# Patient Record
Sex: Female | Born: 1995 | Race: Black or African American | Hispanic: No | Marital: Single | Smoking: Never smoker
Health system: Southern US, Community
[De-identification: ages and names within clinical notes are randomized; demographics above are authoritative.]

## PROBLEM LIST (undated history)

## (undated) DIAGNOSIS — D649 Anemia, unspecified: Secondary | ICD-10-CM

## (undated) HISTORY — DX: Anemia, unspecified: D64.9

---
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
## 2017-02-15 ENCOUNTER — Other Ambulatory Visit (HOSPITAL_COMMUNITY)
Admission: RE | Admit: 2017-02-15 | Discharge: 2017-02-15 | Disposition: A | Discharge status: Home / Self Care | Payer: Medicaid Other | Source: Ambulatory Visit | Attending: Certified Nurse Midwife | Admitting: Certified Nurse Midwife

## 2017-02-15 ENCOUNTER — Encounter: Payer: Self-pay | Admitting: Obstetrics

## 2017-02-15 ENCOUNTER — Inpatient Hospital Stay: Admission: RE | Admit: 2017-02-15 | Payer: Self-pay | Source: Ambulatory Visit

## 2017-02-15 ENCOUNTER — Ambulatory Visit (INDEPENDENT_AMBULATORY_CARE_PROVIDER_SITE_OTHER): Payer: Medicaid Other | Admitting: Certified Nurse Midwife

## 2017-02-15 VITALS — BP 116/76 | HR 88 | Ht 63.0 in | Wt 232.0 lb

## 2017-02-15 DIAGNOSIS — Z0000 Encounter for general adult medical examination without abnormal findings: Secondary | ICD-10-CM

## 2017-02-15 DIAGNOSIS — Z01411 Encounter for gynecological examination (general) (routine) with abnormal findings: Secondary | ICD-10-CM

## 2017-02-15 DIAGNOSIS — N898 Other specified noninflammatory disorders of vagina: Secondary | ICD-10-CM | POA: Diagnosis present

## 2017-02-15 DIAGNOSIS — R87810 Cervical high risk human papillomavirus (HPV) DNA test positive: Secondary | ICD-10-CM | POA: Insufficient documentation

## 2017-02-15 DIAGNOSIS — Z3041 Encounter for surveillance of contraceptive pills: Secondary | ICD-10-CM

## 2017-02-15 DIAGNOSIS — Z7251 High risk heterosexual behavior: Secondary | ICD-10-CM

## 2017-02-15 DIAGNOSIS — Z01419 Encounter for gynecological examination (general) (routine) without abnormal findings: Secondary | ICD-10-CM

## 2017-02-15 DIAGNOSIS — E6609 Other obesity due to excess calories: Secondary | ICD-10-CM

## 2017-02-15 MED ORDER — NORETHIN ACE-ETH ESTRAD-FE 1-20 MG-MCG(24) PO TABS: 1.0000 | ORAL_TABLET | Freq: Every day | ORAL | 12 refills | Status: DC

## 2017-02-15 MED ORDER — PRENATE PIXIE 10-0.6-0.4-200 MG PO CAPS: 1.0000 | ORAL_CAPSULE | Freq: Every day | ORAL | 12 refills | Status: DC
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
## 2017-02-15 NOTE — Progress Notes (Signed)
Transferred to R.Denney CNM, prefers female providers.

## 2017-02-16 ENCOUNTER — Other Ambulatory Visit: Payer: Self-pay | Admitting: Certified Nurse Midwife

## 2017-02-16 DIAGNOSIS — B373 Candidiasis of vulva and vagina: Secondary | ICD-10-CM

## 2017-02-16 DIAGNOSIS — N760 Acute vaginitis: Secondary | ICD-10-CM

## 2017-02-16 DIAGNOSIS — B9689 Other specified bacterial agents as the cause of diseases classified elsewhere: Secondary | ICD-10-CM

## 2017-02-16 DIAGNOSIS — B3731 Acute candidiasis of vulva and vagina: Secondary | ICD-10-CM

## 2017-02-16 LAB — HIV ANTIBODY (ROUTINE TESTING W REFLEX): HIV Screen 4th Generation wRfx: NONREACTIVE

## 2017-02-16 LAB — CBC
HEMATOCRIT: 36.2 % (ref 34.0–46.6)
Hemoglobin: 11.4 g/dL (ref 11.1–15.9)
MCH: 26.1 pg — AB (ref 26.6–33.0)
MCHC: 31.5 g/dL (ref 31.5–35.7)
MCV: 83 fL (ref 79–97)
PLATELETS: 378 10*3/uL (ref 150–379)
RBC: 4.36 x10E6/uL (ref 3.77–5.28)
RDW: 15.3 % (ref 12.3–15.4)
WBC: 6.2 10*3/uL (ref 3.4–10.8)

## 2017-02-16 LAB — COMPREHENSIVE METABOLIC PANEL
ALBUMIN: 4.2 g/dL (ref 3.5–5.5)
ALT: 10 IU/L (ref 0–32)
AST: 15 IU/L (ref 0–40)
Albumin/Globulin Ratio: 1.6 (ref 1.2–2.2)
Alkaline Phosphatase: 40 IU/L — ABNORMAL LOW (ref 43–101)
BUN / CREAT RATIO: 10 (ref 9–23)
BUN: 8 mg/dL (ref 6–20)
CALCIUM: 9.1 mg/dL (ref 8.7–10.2)
CHLORIDE: 103 mmol/L (ref 96–106)
CO2: 22 mmol/L (ref 18–29)
CREATININE: 0.77 mg/dL (ref 0.57–1.00)
GFR, EST AFRICAN AMERICAN: 130 mL/min/{1.73_m2} (ref >59)
GFR, EST NON AFRICAN AMERICAN: 113 mL/min/{1.73_m2} (ref >59)
GLUCOSE: 89 mg/dL (ref 65–99)
Globulin, Total: 2.7 g/dL (ref 1.5–4.5)
Potassium: 4.3 mmol/L (ref 3.5–5.2)
Sodium: 139 mmol/L (ref 134–144)
TOTAL PROTEIN: 6.9 g/dL (ref 6.0–8.5)

## 2017-02-16 LAB — HEPATITIS B SURFACE ANTIGEN: HEP B S AG: NEGATIVE

## 2017-02-16 LAB — CERVICOVAGINAL ANCILLARY ONLY
Bacterial vaginitis: POSITIVE — AB
Candida vaginitis: POSITIVE — AB

## 2017-02-16 LAB — TSH: TSH: 2.35 u[IU]/mL (ref 0.450–4.500)

## 2017-02-16 LAB — RPR: RPR Ser Ql: NONREACTIVE

## 2017-02-16 LAB — HEPATITIS C ANTIBODY: Hep C Virus Ab: <0.1 s/co ratio (ref 0.0–0.9)

## 2017-02-16 MED ORDER — METRONIDAZOLE 500 MG PO TABS: 500.0000 mg | ORAL_TABLET | Freq: Two times a day (BID) | ORAL | 0 refills | Status: DC

## 2017-02-16 MED ORDER — TERCONAZOLE 0.8 % VA CREA: 1.0000 | TOPICAL_CREAM | Freq: Every day | VAGINAL | 0 refills | Status: DC

## 2017-02-16 MED ORDER — FLUCONAZOLE 150 MG PO TABS: 150.0000 mg | ORAL_TABLET | Freq: Once | ORAL | 0 refills | Status: AC

## 2017-02-18 LAB — CERVICOVAGINAL ANCILLARY ONLY
HPV (WINDOPATH): DETECTED — AB
HPV 16/18/45 genotyping: NEGATIVE

## 2017-02-18 LAB — HEMOGLOBIN A1C
Est. average glucose Bld gHb Est-mCnc: 100 mg/dL
Hgb A1c MFr Bld: 5.1 % (ref 4.8–5.6)

## 2017-02-18 LAB — SPECIMEN STATUS REPORT

## 2017-02-19 ENCOUNTER — Emergency Department (HOSPITAL_COMMUNITY): Payer: Medicaid Other

## 2017-02-19 ENCOUNTER — Emergency Department (HOSPITAL_COMMUNITY)
Admission: EM | Admit: 2017-02-19 | Discharge: 2017-02-20 | Disposition: A | Discharge status: Home / Self Care | Payer: Medicaid Other | Attending: Emergency Medicine | Admitting: Emergency Medicine

## 2017-02-19 ENCOUNTER — Encounter (HOSPITAL_COMMUNITY): Payer: Self-pay | Admitting: Emergency Medicine

## 2017-02-19 DIAGNOSIS — Z79899 Other long term (current) drug therapy: Secondary | ICD-10-CM | POA: Insufficient documentation

## 2017-02-19 DIAGNOSIS — S61551A Open bite of right wrist, initial encounter: Secondary | ICD-10-CM | POA: Insufficient documentation

## 2017-02-19 DIAGNOSIS — W503XXA Accidental bite by another person, initial encounter: Secondary | ICD-10-CM

## 2017-02-19 DIAGNOSIS — Z23 Encounter for immunization: Secondary | ICD-10-CM | POA: Diagnosis not present

## 2017-02-19 DIAGNOSIS — S80811A Abrasion, right lower leg, initial encounter: Secondary | ICD-10-CM | POA: Insufficient documentation

## 2017-02-19 DIAGNOSIS — Y9389 Activity, other specified: Secondary | ICD-10-CM | POA: Insufficient documentation

## 2017-02-19 DIAGNOSIS — T148XXA Other injury of unspecified body region, initial encounter: Secondary | ICD-10-CM

## 2017-02-19 DIAGNOSIS — M791 Myalgia, unspecified site: Secondary | ICD-10-CM

## 2017-02-19 DIAGNOSIS — S6991XA Unspecified injury of right wrist, hand and finger(s), initial encounter: Secondary | ICD-10-CM | POA: Diagnosis present

## 2017-02-19 DIAGNOSIS — Y999 Unspecified external cause status: Secondary | ICD-10-CM | POA: Diagnosis not present

## 2017-02-19 DIAGNOSIS — S80812A Abrasion, left lower leg, initial encounter: Secondary | ICD-10-CM | POA: Insufficient documentation

## 2017-02-19 DIAGNOSIS — S5011XA Contusion of right forearm, initial encounter: Secondary | ICD-10-CM | POA: Diagnosis not present

## 2017-02-19 DIAGNOSIS — Y92009 Unspecified place in unspecified non-institutional (private) residence as the place of occurrence of the external cause: Secondary | ICD-10-CM | POA: Insufficient documentation

## 2017-02-19 MED ORDER — TETANUS-DIPHTH-ACELL PERTUSSIS 5-2.5-18.5 LF-MCG/0.5 IM SUSP
0.5000 mL | Freq: Once | INTRAMUSCULAR | Status: AC
  Administered 2017-02-20: 0.5 mL via INTRAMUSCULAR
  Filled 2017-02-19: qty 0.5
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
## 2017-02-20 ENCOUNTER — Emergency Department (HOSPITAL_COMMUNITY): Payer: Medicaid Other

## 2017-02-20 MED ORDER — METHOCARBAMOL 500 MG PO TABS: 500.0000 mg | ORAL_TABLET | Freq: Two times a day (BID) | ORAL | 0 refills | Status: DC | PRN

## 2017-02-20 MED ORDER — AMOXICILLIN-POT CLAVULANATE 875-125 MG PO TABS: 1.0000 | ORAL_TABLET | Freq: Two times a day (BID) | ORAL | 0 refills | Status: DC

## 2017-02-20 MED ORDER — HYDROCODONE-ACETAMINOPHEN 5-325 MG PO TABS
1.0000 | ORAL_TABLET | Freq: Once | ORAL | Status: AC
  Administered 2017-02-20: 1 via ORAL
  Filled 2017-02-20: qty 1

## 2017-02-20 MED ORDER — NAPROXEN 500 MG PO TABS: 500.0000 mg | ORAL_TABLET | Freq: Two times a day (BID) | ORAL | 0 refills | Status: DC | PRN

## 2017-02-20 MED ORDER — BACITRACIN ZINC 500 UNIT/GM EX OINT
TOPICAL_OINTMENT | CUTANEOUS | Status: AC
  Filled 2017-02-20: qty 0.9

## 2017-02-20 NOTE — ED Notes (Signed)
Pt ambulatory to xray with steady gait.
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
## 2017-02-21 ENCOUNTER — Other Ambulatory Visit: Payer: Self-pay | Admitting: Certified Nurse Midwife

## 2017-02-21 DIAGNOSIS — A630 Anogenital (venereal) warts: Secondary | ICD-10-CM

## 2017-02-22 ENCOUNTER — Encounter: Payer: Self-pay | Admitting: *Deleted

## 2017-06-14 ENCOUNTER — Encounter: Payer: Self-pay | Admitting: Certified Nurse Midwife

## 2017-06-14 ENCOUNTER — Ambulatory Visit (INDEPENDENT_AMBULATORY_CARE_PROVIDER_SITE_OTHER): Payer: Medicaid Other | Admitting: Certified Nurse Midwife

## 2017-06-14 DIAGNOSIS — B977 Papillomavirus as the cause of diseases classified elsewhere: Secondary | ICD-10-CM | POA: Diagnosis not present
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
## 2018-08-12 ENCOUNTER — Other Ambulatory Visit: Payer: Self-pay

## 2018-08-12 ENCOUNTER — Encounter (HOSPITAL_COMMUNITY): Payer: Self-pay

## 2018-08-12 ENCOUNTER — Emergency Department (HOSPITAL_COMMUNITY)
Admission: EM | Admit: 2018-08-12 | Discharge: 2018-08-12 | Disposition: A | Discharge status: Home / Self Care | Payer: Self-pay | Attending: Emergency Medicine | Admitting: Emergency Medicine

## 2018-08-12 DIAGNOSIS — Z79899 Other long term (current) drug therapy: Secondary | ICD-10-CM | POA: Insufficient documentation

## 2018-08-12 DIAGNOSIS — J029 Acute pharyngitis, unspecified: Secondary | ICD-10-CM | POA: Insufficient documentation

## 2018-08-12 LAB — GROUP A STREP BY PCR: GROUP A STREP BY PCR: NOT DETECTED

## 2018-08-12 MED ORDER — AMOXICILLIN 500 MG PO CAPS: 500.0000 mg | ORAL_CAPSULE | Freq: Two times a day (BID) | ORAL | 0 refills | Status: AC

## 2018-08-12 NOTE — ED Triage Notes (Addendum)
Pt reports sore throat x2 days. Redness, swelling, and white patches noted in back of throat. Denies, fever, cough, congestion, N/V.

## 2018-08-12 NOTE — Discharge Instructions (Signed)
Take antibiotic twice a day for the next 10 days until it is completely gone.  Come back to the ER if you have any new or concerning symptoms like difficulty moving her neck, inability to swallow, change in your voice or drooling.

## 2018-08-12 NOTE — ED Notes (Signed)
Bed: WTR7 Expected date:  Expected time:  Means of arrival:  Comments: 

## 2018-08-12 NOTE — ED Provider Notes (Signed)
[facility]-EMERGENCY DEPT Provider Note   CSN: [id number] Arrival date & time: 08/12/18  1112     History   Chief Complaint Chief Complaint  Patient presents with  . Sore Throat    HPI Claudia Zimmerman is a 22 y.o. female.  HPI   Claudia Zimmerman is a 22 year old female with no significant past medical history who presents to the emergency department for evaluation of sore throat.  Patient reports that 2 days ago she developed pain in the back of her throat which is worse with swallowing.  Has not taken any over-the-counter medications for her symptoms.  States that she noticed that there were white spots on her tonsils.  States that she feels as if the back of her throat is swollen, but is able to swallow without difficulty.  No sick contacts with similar symptoms.  She denies recent illness with cough, congestion, ear pain, fever.  Denies neck stiffness, headache.  Past Medical History:  Diagnosis Date  . Anemia     Patient Active Problem List   Diagnosis Date Noted  . 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 Genital warts 02/21/2017  . Migraine without aura and without status migrainosus, not intractable 12/02/2014  . Tension headache 12/02/2014    Past Surgical History:  Procedure Laterality Date  . ADENOIDECTOMY Bilateral 2002   Performed in [place]     OB History    Gravida  0   Para  0   Term  0   Preterm  0   AB  0   Living  0     SAB  0   TAB  0   Ectopic  0   Multiple  0   Live Births  0            Home Medications    Prior to Admission medications   Medication Sig Start Date End Date Taking? Authorizing Provider  cetirizine (ZYRTEC) 10 MG tablet Take 10 mg by mouth daily as needed for allergies.  08/30/14   [provider]  clotrimazole (LOTRIMIN) 1 % cream Apply to affected area 2 times daily Patient not taking: Reported on 06/14/2017 06/30/15   Elpidio Anis, PA-C  fluticasone (FLONASE) 50 MCG/ACT nasal  spray Place 2 sprays into both nostrils daily.  08/30/14   [provider]  ibuprofen (ADVIL,MOTRIN) 200 MG tablet Take 400 mg by mouth every 6 (six) hours as needed.    [provider]  naproxen (NAPROSYN) 500 MG tablet Take 1 tablet (500 mg total) by mouth 2 (two) times daily as needed. Patient not taking: Reported on 06/14/2017 02/20/17   Ward, Chase Picket, PA-C  Norethindrone Acetate-Ethinyl Estrad-FE (BLISOVI 24 FE) 1-20 MG-MCG(24) tablet Take 1 tablet by mouth daily. Patient not taking: Reported on 06/14/2017 02/15/17   Orvilla Cornwall A, CNM  Prenat-FeAsp-Meth-FA-DHA w/o A (PRENATE PIXIE) 10-0.6-0.4-200 MG CAPS Take 1 tablet by mouth daily. 02/15/17   Denney, Rachelle A, CNM  STOOL SOFTENER 100 MG capsule Take 100 mg by mouth daily. 08/30/14   [provider]  Vitamin D, Ergocalciferol, (DRISDOL) 50000 UNITS CAPS capsule Take 50,000 Units by mouth.  11/04/14   [provider]    Family History Family History  Problem Relation Age of Onset  . Depression Mother   . Anxiety disorder Mother   . Headache Father   . Bipolar disorder Maternal Grandmother     Social History Social History   Tobacco Use  . Smoking status: Never  Smoker  . Smokeless tobacco: Never Used  Substance Use Topics  . Alcohol use: No  . Drug use: No     Allergies   Other   Review of Systems Review of Systems  Constitutional: Negative for chills and fever.  HENT: Positive for sore throat. Negative for congestion, ear pain, trouble swallowing and voice change.   Respiratory: Negative for cough.   Skin: Negative for rash.     Physical Exam Updated Vital Signs BP 132/85   Pulse 95   Temp 98.2 F (36.8 C)   Resp 16   SpO2 99%   Physical Exam  Constitutional: She appears well-developed and well-nourished. No distress.  No acute distress, nontoxic-appearing.  HENT:  Head: Normocephalic and atraumatic.  Mucous memories moist.  Posterior oropharynx erythematous  with 3+ bilateral tonsillar swelling.  Exudate present.  Uvula midline.  Airway patent and able to handle oral secretions without difficulty.  Eyes: Conjunctivae are normal. Right eye exhibits no discharge. Left eye exhibits no discharge.  Neck: Normal range of motion. Neck supple.  Full neck range of motion.  Bilateral anterior chain cervical adenopathy present.  Pulmonary/Chest: Effort normal. No respiratory distress.  Neurological: She is alert. Coordination normal.  Skin: She is not diaphoretic.  Psychiatric: She has a normal mood and affect. Her behavior is normal.  Nursing note and vitals reviewed.    ED Treatments / Results  Labs (all labs ordered are listed, but only abnormal results are displayed) Labs Reviewed  GROUP A STREP BY PCR    EKG None  Radiology No results found.  Procedures Procedures (including critical care time)  Medications Ordered in ED Medications - No data to display   Initial Impression / Assessment and Plan / ED Course  I have reviewed the triage vital signs and the nursing notes.  Pertinent labs & imaging results that were available during my care of the patient were reviewed by me and considered in my medical decision making (see chart for details).    Pt with sore throat, cervical lymphadenopathy, tonsillar exudate. Exam consistent with strep pharyngitis. Plan to treat with amoxicillin BID x 10d.  Pt appears well hydrated. Presentation non concerning for PTA or RPA. No trismus or uvula deviation. Specific return precautions discussed. Pt able to drink water in ED without difficulty with intact air way.  Final Clinical Impressions(s) / ED Diagnoses   Final diagnoses:  Sore throat    ED Discharge Orders         Ordered    amoxicillin (AMOXIL) 500 MG capsule  2 times daily     08/12/18 1248           Kellie Shropshire, Cordelia Poche 08/12/18 1249    Alvira Monday, MD 08/15/18 2210
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
## 2018-11-17 ENCOUNTER — Encounter (HOSPITAL_COMMUNITY): Payer: Self-pay

## 2018-11-17 ENCOUNTER — Emergency Department (HOSPITAL_COMMUNITY)
Admission: EM | Admit: 2018-11-17 | Discharge: 2018-11-17 | Disposition: A | Discharge status: Home / Self Care | Payer: Medicaid Other | Attending: Emergency Medicine | Admitting: Emergency Medicine

## 2018-11-17 DIAGNOSIS — L0501 Pilonidal cyst with abscess: Secondary | ICD-10-CM | POA: Insufficient documentation

## 2018-11-17 MED ORDER — HYDROCODONE-ACETAMINOPHEN 5-325 MG PO TABS: 1.0000 | ORAL_TABLET | Freq: Four times a day (QID) | ORAL | 0 refills | Status: DC | PRN

## 2018-11-17 MED ORDER — AMOXICILLIN-POT CLAVULANATE 875-125 MG PO TABS: 1.0000 | ORAL_TABLET | Freq: Two times a day (BID) | ORAL | 0 refills | Status: DC

## 2018-11-17 MED ORDER — IBUPROFEN 800 MG PO TABS: 800.0000 mg | ORAL_TABLET | Freq: Three times a day (TID) | ORAL | 0 refills | Status: DC | PRN

## 2018-11-17 MED ORDER — AMOXICILLIN-POT CLAVULANATE 875-125 MG PO TABS
1.0000 | ORAL_TABLET | Freq: Once | ORAL | Status: DC
  Filled 2018-11-17: qty 1

## 2018-11-17 MED ORDER — LIDOCAINE-EPINEPHRINE (PF) 2 %-1:200000 IJ SOLN
20.0000 mL | Freq: Once | INTRAMUSCULAR | Status: AC
  Administered 2018-11-17: 20 mL
  Filled 2018-11-17: qty 20

## 2018-11-17 MED ORDER — HYDROMORPHONE HCL 1 MG/ML IJ SOLN
1.0000 mg | Freq: Once | INTRAMUSCULAR | Status: AC
  Administered 2018-11-17: 1 mg via INTRAVENOUS
  Filled 2018-11-17: qty 1

## 2018-11-17 MED ORDER — SODIUM CHLORIDE 0.9 % IV BOLUS
500.0000 mL | Freq: Once | INTRAVENOUS | Status: AC
  Administered 2018-11-17: 500 mL via INTRAVENOUS

## 2018-11-17 NOTE — ED Triage Notes (Signed)
Pt believes that she has a cyst on her tailbone. Pt reports that the spot is swollen and red but no drainage from that area.

## 2018-11-17 NOTE — ED Provider Notes (Signed)
[facility]-EMERGENCY DEPT Provider Note   CSN: [id number] Arrival date & time: 11/17/18  1005     History   Chief Complaint Chief Complaint  Patient presents with  . Cyst    HPI Claudia Zimmerman is a 23 y.o. female.  The history is provided by the patient. No language interpreter was used.   Claudia Zimmerman is a 23 y.o. female who presents to the Emergency Department complaining of bump on bottom. She presents to the emergency department complaining of a lump on her tailbone. She noticed cyst about six days ago. She has increased pain and swelling in the area. She had a temperature to 100.52 days ago. She denies any nausea, vomiting, abdominal pain, diarrhea. No prior similar symptoms. Symptoms are moderate, constant, worsening. Past Medical History:  Diagnosis Date  . Anemia     Patient Active Problem List   Diagnosis Date Noted  . 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 Genital warts 02/21/2017  . Migraine without aura and without status migrainosus, not intractable 12/02/2014  . Tension headache 12/02/2014    Past Surgical History:  Procedure Laterality Date  . ADENOIDECTOMY Bilateral 2002   Performed in [place]     OB History    Gravida  0   Para  0   Term  0   Preterm  0   AB  0   Living  0     SAB  0   TAB  0   Ectopic  0   Multiple  0   Live Births  0            Home Medications    Prior to Admission medications   Medication Sig Start Date End Date Taking? Authorizing Provider  acetaminophen (TYLENOL) 325 MG tablet Take 975 mg by mouth every 6 (six) hours as needed for mild pain or headache.   Yes [provider]  amoxicillin-clavulanate (AUGMENTIN) 875-125 MG tablet Take 1 tablet by mouth every 12 (twelve) hours. 11/17/18   Tilden Fossa, MD  clotrimazole (LOTRIMIN) 1 % cream Apply to affected area 2 times daily Patient not taking: Reported on 06/14/2017 06/30/15   Elpidio Anis, PA-C    HYDROcodone-acetaminophen (NORCO/VICODIN) 5-325 MG tablet Take 1 tablet by mouth every 6 (six) hours as needed. 11/17/18   Tilden Fossa, MD  ibuprofen (ADVIL,MOTRIN) 800 MG tablet Take 1 tablet (800 mg total) by mouth every 8 (eight) hours as needed. 11/17/18   Tilden Fossa, MD  naproxen (NAPROSYN) 500 MG tablet Take 1 tablet (500 mg total) by mouth 2 (two) times daily as needed. Patient not taking: Reported on 06/14/2017 02/20/17   Ward, Chase Picket, PA-C  Norethindrone Acetate-Ethinyl Estrad-FE (BLISOVI 24 FE) 1-20 MG-MCG(24) tablet Take 1 tablet by mouth daily. Patient not taking: Reported on 06/14/2017 02/15/17   Orvilla Cornwall A, CNM  Prenat-FeAsp-Meth-FA-DHA w/o A (PRENATE PIXIE) 10-0.6-0.4-200 MG CAPS Take 1 tablet by mouth daily. Patient not taking: Reported on 11/17/2018 02/15/17   Roe Coombs, CNM    Family History Family History  Problem Relation Age of Onset  . Depression Mother   . Anxiety disorder Mother   . Headache Father   . Bipolar disorder Maternal Grandmother     Social History Social History   Tobacco Use  . Smoking status: Never Smoker  . Smokeless tobacco: Never Used  Substance Use Topics  . Alcohol use: No  . Drug use: No     Allergies   Other  Review of Systems Review of Systems  All other systems reviewed and are negative.    Physical Exam Updated Vital Signs BP 125/75 (BP Location: Right Arm)   Pulse 99   Temp 98.5 F (36.9 C) (Oral)   Resp 18   Ht 5\' 2"  (1.575 m)   Wt 108.9 kg   LMP 11/02/2018 (Approximate)   SpO2 99%   BMI 43.90 kg/m   Physical Exam Vitals signs and nursing note reviewed. Exam conducted with a chaperone present.  Constitutional:      Appearance: Normal appearance.  HENT:     Head: Normocephalic and atraumatic.     Mouth/Throat:     Mouth: Mucous membranes are moist.  Neck:     Musculoskeletal: Normal range of motion.  Cardiovascular:     Rate and Rhythm: Normal rate and regular rhythm.   Pulmonary:     Effort: Pulmonary effort is normal. No respiratory distress.  Abdominal:     General: There is no distension.     Palpations: Abdomen is soft.     Tenderness: There is no abdominal tenderness.  Genitourinary:    Comments: Moderate erythema, induration and tenderness with focal fluctuance at the gluteal cleft Skin:    General: Skin is warm and dry.     Capillary Refill: Capillary refill takes less than 2 seconds.  Neurological:     Mental Status: She is alert and oriented to person, place, and time.  Psychiatric:        Mood and Affect: Mood normal.        Behavior: Behavior normal.      ED Treatments / Results  Labs (all labs ordered are listed, but only abnormal results are displayed) Labs Reviewed - No data to display  EKG None  Radiology No results found.  Procedures .Marland Kitchen.Incision and Drainage Date/Time: 11/17/2018 3:44 PM Performed by: Tilden Fossaees, Marialice Newkirk, MD Authorized by: Tilden Fossaees, Todrick Siedschlag, MD   Consent:    Consent obtained:  Verbal   Consent given by:  Patient   Risks discussed:  Bleeding, incomplete drainage, pain and infection Location:    Type:  Pilonidal cyst Pre-procedure details:    Skin preparation:  Chloraprep Anesthesia (see MAR for exact dosages):    Anesthesia method:  Local infiltration   Local anesthetic:  Lidocaine 2% WITH epi Procedure type:    Complexity:  Complex Procedure details:    Incision types:  Single straight   Scalpel blade:  11   Wound management:  Probed and deloculated, irrigated with saline and extensive cleaning   Drainage:  Purulent   Drainage amount:  Copious   Wound treatment:  Drain placed   Packing materials:  1/2 in iodoform gauze Post-procedure details:    Patient tolerance of procedure:  Tolerated well, no immediate complications Comments:     Abscess cavity was 3 to 4 cm deep and 4 to 5 cm in diameter.   (including critical care time)  Medications Ordered in ED Medications  amoxicillin-clavulanate  (AUGMENTIN) 875-125 MG per tablet 1 tablet (has no administration in time range)  HYDROmorphone (DILAUDID) injection 1 mg (1 mg Intravenous Given 11/17/18 1211)  sodium chloride 0.9 % bolus 500 mL (0 mLs Intravenous Stopped 11/17/18 1408)  lidocaine-EPINEPHrine (XYLOCAINE W/EPI) 2 %-1:200000 (PF) injection 20 mL (20 mLs Infiltration Given by Other 11/17/18 1211)     Initial Impression / Assessment and Plan / ED Course  I have reviewed the triage vital signs and the nursing notes.  Pertinent labs & imaging results that  were available during my care of the patient were reviewed by me and considered in my medical decision making (see chart for details).     Patient here for evaluation of pain and swelling to her tailbone. She has a large pile an idol cyst on examination. I&D formed per procedure note. Patient tolerated the procedure well. Given the extent and size of abscess plan to have her follow up with general surgery. Discussed with patient local wound care as well as return precautions. Presentation is not consistent with sepsis.  Final Clinical Impressions(s) / ED Diagnoses   Final diagnoses:  Pilonidal cyst with abscess    ED Discharge Orders         Ordered    amoxicillin-clavulanate (AUGMENTIN) 875-125 MG tablet  Every 12 hours     11/17/18 1422    HYDROcodone-acetaminophen (NORCO/VICODIN) 5-325 MG tablet  Every 6 hours PRN     11/17/18 1422    ibuprofen (ADVIL,MOTRIN) 800 MG tablet  Every 8 hours PRN     11/17/18 1422           Tilden Fossa, MD 11/17/18 1546

## 2018-11-17 NOTE — ED Notes (Signed)
Scheduled appointment for patient at [facility] per Dr. Madilyn Hook request for Tuesday 11/2118. Be there 1:30 to do new patient paperwork. Appointment with Puja,PA at 2:00pm.

## 2020-07-15 ENCOUNTER — Encounter (HOSPITAL_COMMUNITY): Payer: Self-pay

## 2020-07-15 ENCOUNTER — Emergency Department (HOSPITAL_COMMUNITY)
Admission: EM | Admit: 2020-07-15 | Discharge: 2020-07-15 | Disposition: A | Discharge status: Home / Self Care | Payer: Medicaid Other | Attending: Emergency Medicine | Admitting: Emergency Medicine

## 2020-07-15 ENCOUNTER — Other Ambulatory Visit: Payer: Self-pay

## 2020-07-15 DIAGNOSIS — L0591 Pilonidal cyst without abscess: Secondary | ICD-10-CM | POA: Insufficient documentation

## 2020-07-15 MED ORDER — LIDOCAINE-EPINEPHRINE (PF) 2 %-1:200000 IJ SOLN
10.0000 mL | Freq: Once | INTRAMUSCULAR | Status: AC
  Administered 2020-07-15: 7 mL
  Filled 2020-07-15: qty 20

## 2020-07-15 NOTE — Discharge Instructions (Signed)
Thank you for allowing me to care for you today in the Emergency Department.   Take 650 mg of Tylenol or 600 mg of ibuprofen with food every 6 hours for pain.  You can alternate between these 2 medications every 3 hours if your pain returns.  For instance, you can take Tylenol at noon, followed by a dose of ibuprofen at 3, followed by second dose of Tylenol and 6.  You can take a sitz bath up to 3-4 times a day to help with your symptoms.  You can also try applying a warm washcloth over the area to help with pain or swelling.  Keep the area covered until the incision closes.  Call to schedule a follow-up appointment with general surgery or dermatology for further evaluation since cyst and abscesses in this area can continue to return.  You should return to the emergency department if you develop high fevers, rectal pain, significantly worsening redness or swelling to the area despite drainage today, or other new, concerning symptoms.

## 2020-07-15 NOTE — ED Provider Notes (Signed)
[facility]-EMERGENCY DEPT Provider Note   CSN: [id number] Arrival date & time: 07/15/20  0035     History Chief Complaint  Patient presents with  . Abscess    Claudia Zimmerman is a 24 y.o. female with a history of genital warts and migraines who presents to the emergency department with a chief complaint of pain and swelling to the gluteal cleft over the last 2 to 3 days.  She reports a history of 2 previous pilonidal abscesses that have required incision and drainage.  Reports that she followed up with general surgery after the first I&D but has not since followed up.  She reports the pain is 7 out of 10 constant, and worsening.  She has been able to sit, but is uncomfortable.  No fevers, chills, rectal pain, abdominal pain, nausea, vomiting, diarrhea red streaking.  No treatment prior to arrival.  The history is provided by the patient. No language interpreter was used.       Past Medical History:  Diagnosis Date  . Anemia     Patient Active Problem List   Diagnosis Date Noted  . 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 Genital warts 02/21/2017  . Migraine without aura and without status migrainosus, not intractable 12/02/2014  . Tension headache 12/02/2014    Past Surgical History:  Procedure Laterality Date  . ADENOIDECTOMY Bilateral 2002   Performed in [place]     OB History    Gravida  0   Para  0   Term  0   Preterm  0   AB  0   Living  0     SAB  0   TAB  0   Ectopic  0   Multiple  0   Live Births  0           Family History  Problem Relation Age of Onset  . Depression Mother   . Anxiety disorder Mother   . Headache Father   . Bipolar disorder Maternal Grandmother     Social History   Tobacco Use  . Smoking status: Never Smoker  . Smokeless tobacco: Never Used  Substance Use Topics  . Alcohol use: No  . Drug use: No    Home Medications Prior to Admission medications   Medication Sig Start Date End Date Taking?  Authorizing Provider  acetaminophen (TYLENOL) 325 MG tablet Take 975 mg by mouth every 6 (six) hours as needed for mild pain or headache.   Yes [provider]    Allergies    Other  Review of Systems   Review of Systems  Constitutional: Negative for activity change, chills and fever.  Respiratory: Negative for shortness of breath, wheezing and stridor.   Cardiovascular: Negative for chest pain and palpitations.  Gastrointestinal: Negative for abdominal pain, anal bleeding, blood in stool, diarrhea, nausea and vomiting.  Musculoskeletal: Negative for back pain, myalgias, neck pain and neck stiffness.  Skin: Positive for wound. Negative for rash.  Neurological: Negative for seizures, syncope, weakness and numbness.    Physical Exam Updated Vital Signs BP (!) 116/53 (BP Location: Left Arm)   Pulse 88   Temp 98.6 F (37 C) (Oral)   Resp 18   SpO2 100%   Physical Exam Vitals and nursing note reviewed.  Constitutional:      General: She is not in acute distress. HENT:     Head: Normocephalic.  Eyes:     Conjunctiva/sclera: Conjunctivae normal.  Cardiovascular:  Rate and Rhythm: Normal rate and regular rhythm.     Heart sounds: No murmur heard.  No friction rub. No gallop.   Pulmonary:     Effort: Pulmonary effort is normal. No respiratory distress.  Abdominal:     General: There is no distension.     Palpations: Abdomen is soft. There is no mass.     Tenderness: There is no abdominal tenderness. There is no right CVA tenderness, left CVA tenderness, guarding or rebound.     Hernia: No hernia is present.  Genitourinary:    Comments: Chaperoned exam.  Fluctuance is noted to the right superior gluteal cleft.  No overlying redness or warmth.  No red streaking. Musculoskeletal:     Cervical back: Neck supple.  Skin:    General: Skin is warm.     Findings: No rash.  Neurological:     Mental Status: She is alert.  Psychiatric:        Behavior: Behavior normal.       ED Results / Procedures / Treatments   Labs (all labs ordered are listed, but only abnormal results are displayed) Labs Reviewed - No data to display  EKG None  Radiology No results found.  Procedures .Marland KitchenIncision and Drainage  Date/Time: 07/15/2020 6:06 AM Performed by: Barkley Boards, PA-C Authorized by: Barkley Boards, PA-C   Consent:    Consent obtained:  Verbal   Consent given by:  Patient   Risks discussed:  Bleeding, incomplete drainage, pain and infection   Alternatives discussed:  No treatment and referral Location:    Type:  Cyst   Size:  3   Location:  Anogenital   Anogenital location:  Gluteal cleft Pre-procedure details:    Skin preparation:  Betadine Anesthesia (see MAR for exact dosages):    Anesthesia method:  Local infiltration   Local anesthetic:  Lidocaine 2% WITH epi Procedure type:    Complexity:  Simple Procedure details:    Needle aspiration: no     Incision types:  Single straight   Incision depth:  Dermal   Scalpel blade:  11   Wound management:  Probed and deloculated and irrigated with saline   Drainage:  Serosanguinous   Drainage amount:  Scant   Wound treatment:  Wound left open   Packing materials:  None Post-procedure details:    Patient tolerance of procedure:  Tolerated well, no immediate complications   (including critical care time)  Medications Ordered in ED Medications  lidocaine-EPINEPHrine (XYLOCAINE W/EPI) 2 %-1:200000 (PF) injection 10 mL (has no administration in time range)    ED Course  I have reviewed the triage vital signs and the nursing notes.  Pertinent labs & imaging results that were available during my care of the patient were reviewed by me and considered in my medical decision making (see chart for details).    MDM Rules/Calculators/A&P                          24 year old female with a history of genital warts, pilonidal abscess, and migraines presenting with pain and swelling to the gluteal  cleft over the last few days.  No constitutional symptoms.  On exam, there is an area of fluctuance to the superior right gluteal cleft that is amenable to incision and drainage.  She tolerated the procedure well and a small amount of serosanguineous drainage was expressed from the wound.  Doubt pilonidal abscess and suspect she has had a pilonidal  cyst.  There is significant scar tissue and from previous I&D noted to the area.  Antibiotics not indicated at this time.  Recommended conservative management at home and follow-up with dermatology or general surgery.  Patient and her mother are agreeable with plan at this time.  All questions answered.  She is hemodynamically stable and in no acute distress.  Safe for discharge home with outpatient follow-up as indicated.  Final Clinical Impression(s) / ED Diagnoses Final diagnoses:  Pilonidal cyst without abscess    Rx / DC Orders ED Discharge Orders    None       Barkley Boards, PA-C 07/15/20 0610    Shon Baton, MD 07/16/20 [phone]

## 2020-07-15 NOTE — ED Triage Notes (Signed)
Pt reports an abscess between buttocks.

## 2020-07-20 ENCOUNTER — Encounter (HOSPITAL_COMMUNITY): Payer: Self-pay | Admitting: *Deleted

## 2020-07-20 ENCOUNTER — Emergency Department (HOSPITAL_COMMUNITY)
Admission: EM | Admit: 2020-07-20 | Discharge: 2020-07-20 | Disposition: A | Discharge status: Home / Self Care | Payer: Medicaid Other | Attending: Emergency Medicine | Admitting: Emergency Medicine

## 2020-07-20 ENCOUNTER — Other Ambulatory Visit: Payer: Self-pay

## 2020-07-20 DIAGNOSIS — L0501 Pilonidal cyst with abscess: Secondary | ICD-10-CM | POA: Insufficient documentation

## 2020-07-20 LAB — POC URINE PREG, ED: Preg Test, Ur: NEGATIVE

## 2020-07-20 MED ORDER — CEPHALEXIN 500 MG PO CAPS
500.0000 mg | ORAL_CAPSULE | Freq: Once | ORAL | Status: AC
  Administered 2020-07-20: 500 mg via ORAL
  Filled 2020-07-20: qty 1

## 2020-07-20 MED ORDER — CEPHALEXIN 500 MG PO CAPS: 500.0000 mg | ORAL_CAPSULE | Freq: Four times a day (QID) | ORAL | 0 refills | Status: AC

## 2020-07-20 MED ORDER — SULFAMETHOXAZOLE-TRIMETHOPRIM 800-160 MG PO TABS
1.0000 | ORAL_TABLET | Freq: Two times a day (BID) | ORAL | 0 refills | Status: AC
Start: 2020-07-20 — End: 2020-07-27

## 2020-07-20 MED ORDER — LIDOCAINE HCL (PF) 1 % IJ SOLN
5.0000 mL | Freq: Once | INTRAMUSCULAR | Status: AC
  Administered 2020-07-20: 5 mL
  Filled 2020-07-20: qty 30

## 2020-07-20 MED ORDER — LIDOCAINE-EPINEPHRINE-TETRACAINE (LET) TOPICAL GEL
3.0000 mL | Freq: Once | TOPICAL | Status: AC
  Administered 2020-07-20: 3 mL via TOPICAL
  Filled 2020-07-20: qty 3

## 2020-07-20 MED ORDER — HYDROCODONE-ACETAMINOPHEN 5-325 MG PO TABS
1.0000 | ORAL_TABLET | Freq: Once | ORAL | Status: AC
  Administered 2020-07-20: 1 via ORAL
  Filled 2020-07-20: qty 1

## 2020-07-20 MED ORDER — SULFAMETHOXAZOLE-TRIMETHOPRIM 800-160 MG PO TABS
1.0000 | ORAL_TABLET | Freq: Once | ORAL | Status: AC
  Administered 2020-07-20: 1 via ORAL
  Filled 2020-07-20: qty 1

## 2020-07-20 NOTE — ED Provider Notes (Signed)
[facility]-EMERGENCY DEPT Provider Note   CSN: [id number] Arrival date & time: 07/20/20  1328     History Chief Complaint  Patient presents with  . Pilonidal cyst  Draining    Claudia Zimmerman is a 24 y.o. female history of pilonidal cysts, HPV, genital warts, migraines, obesity.  Patient arrives with concern of infected pilonidal cyst.  She reports that she had pain and swelling to the area that began around a week ago.  She was seen in the emergency department and underwent I&D, there is no evidence of infection, she was discharged after she felt better.  She reports that she did not perform any sitz bath's or other home remedies.  However the last 2 days she feels that the pain and swelling have worsened and she has noted purulent smelly drainage from the area.  She describes a moderate intensity throbbing pain to the top of her right gluteal cleft nonradiating worsened with sitting no alleviating factors.  Denies fever/chills, chest pain, abdominal pain, nausea/vomiting, dysuria/hematuria, pain with bowel movements, melena, hematochezia or any additional concerns. HPI     Past Medical History:  Diagnosis Date  . Anemia     Patient Active Problem List   Diagnosis Date Noted  . 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 Genital warts 02/21/2017  . Migraine without aura and without status migrainosus, not intractable 12/02/2014  . Tension headache 12/02/2014    Past Surgical History:  Procedure Laterality Date  . ADENOIDECTOMY Bilateral 2002   Performed in [place]     OB History    Gravida  0   Para  0   Term  0   Preterm  0   AB  0   Living  0     SAB  0   TAB  0   Ectopic  0   Multiple  0   Live Births  0           Family History  Problem Relation Age of Onset  . Depression Mother   . Anxiety disorder Mother   . Headache Father   . Bipolar disorder Maternal Grandmother     Social History   Tobacco Use  . Smoking status:  Never Smoker  . Smokeless tobacco: Never Used  Substance Use Topics  . Alcohol use: No  . Drug use: No    Home Medications Prior to Admission medications   Medication Sig Start Date End Date Taking? Authorizing Provider  acetaminophen (TYLENOL) 325 MG tablet Take 975 mg by mouth every 6 (six) hours as needed for mild pain or headache.    [provider]  cephALEXin (KEFLEX) 500 MG capsule Take 1 capsule (500 mg total) by mouth 4 (four) times daily for 7 days. 07/20/20 07/27/20  Harlene Salts A, PA-C  sulfamethoxazole-trimethoprim (BACTRIM DS) 800-160 MG tablet Take 1 tablet by mouth 2 (two) times daily for 7 days. 07/20/20 07/27/20  Bill Salinas, PA-C    Allergies    Other  Review of Systems   Review of Systems Ten systems are reviewed and are negative for acute change except as noted in the HPI  Physical Exam Updated Vital Signs BP (!) 144/112 (BP Location: Right Arm)   Pulse (!) 107   Temp 98.9 F (37.2 C) (Oral)   Resp (!) 24   Ht 5\' 2"  (1.575 m)   Wt 116.1 kg   SpO2 99%   BMI 46.82 kg/m   Physical Exam Constitutional:  General: She is not in acute distress.    Appearance: Normal appearance. She is well-developed. She is obese. She is not ill-appearing or diaphoretic.  HENT:     Head: Normocephalic and atraumatic.  Eyes:     General: Vision grossly intact. Gaze aligned appropriately.     Pupils: Pupils are equal, round, and reactive to light.  Neck:     Trachea: Trachea and phonation normal.  Pulmonary:     Effort: Pulmonary effort is normal. No respiratory distress.  Abdominal:     General: There is no distension.     Palpations: Abdomen is soft.     Tenderness: There is no abdominal tenderness. There is no guarding or rebound.  Musculoskeletal:        General: Normal range of motion.     Cervical back: Normal range of motion.  Skin:    General: Skin is warm and dry.          Comments: Examination and procedures chaperoned by Lydia Guiles and  patient's mother.  Right superior gluteal cleft: Approximately 2 cm diameter area of slight fluctuance and induration, scar tissue present from previous I&D's.  She has evidence of previous incision and drainage a few days ago which is draining a small amount of purulence.  No tracking towards the rectum.  Neurological:     Mental Status: She is alert.     GCS: GCS eye subscore is 4. GCS verbal subscore is 5. GCS motor subscore is 6.     Comments: Speech is clear and goal oriented, follows commands Major Cranial nerves without deficit, no facial droop Moves extremities without ataxia, coordination intact  Psychiatric:        Behavior: Behavior normal.     ED Results / Procedures / Treatments   Labs (all labs ordered are listed, but only abnormal results are displayed) Labs Reviewed  AEROBIC CULTURE (SUPERFICIAL SPECIMEN)  POC URINE PREG, ED    EKG None  Radiology No results found.  Procedures Procedures (including critical care time)  Medications Ordered in ED Medications  lidocaine-EPINEPHrine-tetracaine (LET) topical gel (3 mLs Topical Given 07/20/20 1622)  lidocaine (PF) (XYLOCAINE) 1 % injection 5 mL (5 mLs Infiltration Given 07/20/20 1622)  HYDROcodone-acetaminophen (NORCO/VICODIN) 5-325 MG per tablet 1 tablet (1 tablet Oral Given 07/20/20 1618)  sulfamethoxazole-trimethoprim (BACTRIM DS) 800-160 MG per tablet 1 tablet (1 tablet Oral Given 07/20/20 1618)  cephALEXin (KEFLEX) capsule 500 mg (500 mg Oral Given 07/20/20 1618)    ED Course  I have reviewed the triage vital signs and the nursing notes.  Pertinent labs & imaging results that were available during my care of the patient were reviewed by me and considered in my medical decision making (see chart for details).    MDM Rules/Calculators/A&P                          Additional history obtained from: 1. Nursing notes from this visit. 2. Electronic medical record review. ------ Urine pregnancy test  negative.  Patient presents today for pilonidal abscess.  She had a cyst in the area that was previously drained earlier this week.  There is no sign of infection at that time and patient had not started on antibiotics.  Unfortunately patient did not perform home care or sits baths as instructed.  Over the last 2 days she has developed purulent drainage.  She had a small area of drainage on exam, patient requested for this area to  be opened up more, felt this would be beneficial for the patient to facilitate healing.  Procedure performed as above, nurse Alvino Chapel as chaperone. No large enough to warrant packing. Moderate amount of purulence was drained from the area, wound culture sent.  Patient started on Keflex/Bactrim and referred to general surgery for follow-up care.  Patient encouraged to have this area rechecked in 3 days either by her PCP, general surgeon or here at the emergency department if necessary to ensure healing.  Patient overall well-appearing no acute distress, does not appear toxic/septic, appropriate for outpatient management.  At this time there does not appear to be any evidence of an acute emergency medical condition and the patient appears stable for discharge with appropriate outpatient follow up. Diagnosis was discussed with patient who verbalizes understanding of care plan and is agreeable to discharge. I have discussed return precautions with patient and mother who verbalizes understanding. Patient encouraged to follow-up with their PCP and general surgery. All questions answered.   Note: Portions of this report may have been transcribed using voice recognition software. Every effort was made to ensure accuracy; however, inadvertent computerized transcription errors may still be present. Final Clinical Impression(s) / ED Diagnoses Final diagnoses:  Pilonidal cyst with abscess    Rx / DC Orders ED Discharge Orders         Ordered    cephALEXin (KEFLEX) 500 MG capsule  4 times  daily        07/20/20 1634    sulfamethoxazole-trimethoprim (BACTRIM DS) 800-160 MG tablet  2 times daily        07/20/20 1634           Elizabeth Palau 07/20/20 1652    Gwyneth Sprout, MD 07/20/20 1709

## 2020-07-20 NOTE — Discharge Instructions (Addendum)
At this time there does not appear to be the presence of an emergent medical condition, however there is always the potential for conditions to change. Please read and follow the below instructions.  Please return to the Emergency Department immediately for any new or worsening symptoms. Please be sure to follow up with your Primary Care Provider within one week regarding your visit today; please call their office to schedule an appointment even if you are feeling better for a follow-up visit. Please take your antibiotic Bactrim and Keflex as prescribed until complete to help with your symptoms.  Please drink enough water to avoid dehydration and get plenty of rest. Please have your abscess rechecked in 3 days.  You may have it rechecked by the general surgeon, your primary care doctor or here at the emergency department.  Return to the ER sooner if you develop any worsening symptoms. The pus from your abscess was sent for culture today.  If it grows out bacteria that require different antibiotics than those you were prescribed you will be called with a new prescription. Continue using the sitz bath's and other home therapies to help with your drainage.  Go to the closest emergency department immediately if: You have redness, swelling, or more pain around your wound. You have more fluid or blood coming from your wound. You have new bleeding from your wound. Your wound feels warm to the touch. There is pus or a bad smell coming from your wound. You have pain that does not get better with medicine. You have a fever or chills. You have muscle aches. You are dizzy. You feel generally sick. You have fever or chills You have any new/concerning or worsening of symptoms   Please read the additional information packets attached to your discharge summary.  Do not take your medicine if  develop an itchy rash, swelling in your mouth or lips, or difficulty breathing; call 911 and seek immediate  emergency medical attention if this occurs.  You may review your lab tests and imaging results in their entirety on your MyChart account.  Please discuss all results of fully with your primary care provider and other specialist at your follow-up visit.  Note: Portions of this text may have been transcribed using voice recognition software. Every effort was made to ensure accuracy; however, inadvertent computerized transcription errors may still be present.

## 2020-07-20 NOTE — ED Triage Notes (Signed)
Pilonidal cyst treated on 9/14, now draining with foul odor

## 2020-07-23 LAB — AEROBIC CULTURE W GRAM STAIN (SUPERFICIAL SPECIMEN)

## 2020-07-24 ENCOUNTER — Telehealth: Payer: Self-pay | Admitting: *Deleted

## 2020-07-24 NOTE — Telephone Encounter (Signed)
Post ED Visit - Positive Culture Follow-up  Culture report reviewed by antimicrobial stewardship pharmacist: [facility] []  , Pharm.D. []  Enzo Bi, Pharm.D., BCPS AQ-ID []  , Pharm.D., BCPS []  Celedonio Miyamoto, Pharm.D., BCPS []  [place], Garvin Fila.D., BCPS, AAHIVP []  , Pharm.D., BCPS, AAHIVP []  Georgina Pillion, PharmD, BCPS []  , PharmD, BCPS []  [place], PharmD, BCPS []  [address], PharmD []  , PharmD, BCPS []  Estella Husk, PharmD  Pharmacy Team []  Lysle Pearl, PharmD [x]  , PharmD []  Phillips Climes, PharmD []  , Rph []  Agapito Games) , PharmD []  Verlan Friends, PharmD []  , PharmD []  Mervyn Gay, PharmD []  , PharmD []  Vinnie Level, PharmD []  [facility], PharmD []  , PharmD []  Len Childs, PharmD   Positive wound culture Treated with Cephalexin and Sulfamethoxazole-Trimethoprim, organism sensitive to the same and no further patient follow-up is required at this time.  [facility] 07/24/2020, 12:08 PM

## 2021-09-06 ENCOUNTER — Other Ambulatory Visit: Payer: Self-pay

## 2021-09-06 ENCOUNTER — Emergency Department (HOSPITAL_COMMUNITY)
Admission: EM | Admit: 2021-09-06 | Discharge: 2021-09-06 | Disposition: A | Discharge status: Home / Self Care | Payer: BLUE CROSS/BLUE SHIELD | Attending: Emergency Medicine | Admitting: Emergency Medicine

## 2021-09-06 DIAGNOSIS — R0981 Nasal congestion: Secondary | ICD-10-CM | POA: Insufficient documentation

## 2021-09-06 DIAGNOSIS — R059 Cough, unspecified: Secondary | ICD-10-CM | POA: Diagnosis not present

## 2021-09-06 DIAGNOSIS — Z20822 Contact with and (suspected) exposure to covid-19: Secondary | ICD-10-CM | POA: Diagnosis not present

## 2021-09-06 DIAGNOSIS — J029 Acute pharyngitis, unspecified: Secondary | ICD-10-CM | POA: Diagnosis present

## 2021-09-06 DIAGNOSIS — Z28310 Unvaccinated for covid-19: Secondary | ICD-10-CM | POA: Insufficient documentation

## 2021-09-06 DIAGNOSIS — R519 Headache, unspecified: Secondary | ICD-10-CM | POA: Insufficient documentation

## 2021-09-06 LAB — RESP PANEL BY RT-PCR (FLU A&B, COVID) ARPGX2
Influenza A by PCR: NEGATIVE
Influenza B by PCR: NEGATIVE
SARS Coronavirus 2 by RT PCR: NEGATIVE

## 2021-09-06 MED ORDER — BENZONATATE 100 MG PO CAPS: 100.0000 mg | ORAL_CAPSULE | Freq: Three times a day (TID) | ORAL | 0 refills | Status: DC

## 2021-09-06 NOTE — ED Provider Notes (Signed)
[facility]-EMERGENCY DEPT Provider Note   CSN: [id number] Arrival date & time: 09/06/21  1045     History Chief Complaint  Patient presents with   Sore Throat    Claudia Zimmerman is a 25 y.o. female.  HPI  Patient presents with sore throat x4 days.  She is also having nasal congestion and an intermittent headache.  Denies any body aches or fevers at home.  She has tried over-the-counter remedies with minimal relief.  She does report intermittent cough which is nonproductive in nature.  Denies any shortness of breath or chest pain.  No history of asthma or underlying pulmonary disease.  She has not COVID or flu vaccinated.  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   Genital warts 02/21/2017   Migraine without aura and without status migrainosus, not intractable 12/02/2014   Tension headache 12/02/2014    Past Surgical History:  Procedure Laterality Date   ADENOIDECTOMY Bilateral 2002   Performed in [place]     OB History     Gravida  0   Para  0   Term  0   Preterm  0   AB  0   Living  0      SAB  0   IAB  0   Ectopic  0   Multiple  0   Live Births  0           Family History  Problem Relation Age of Onset   Depression Mother    Anxiety disorder Mother    Headache Father    Bipolar disorder Maternal Grandmother     Social History   Tobacco Use   Smoking status: Never   Smokeless tobacco: Never  Substance Use Topics   Alcohol use: No   Drug use: No    Home Medications Prior to Admission medications   Medication Sig Start Date End Date Taking? Authorizing Provider  acetaminophen (TYLENOL) 325 MG tablet Take 975 mg by mouth every 6 (six) hours as needed for mild pain or headache.    [provider]    Allergies    Other  Review of Systems   Review of Systems  Constitutional:  Negative for fever.  HENT:   Positive for congestion and sore throat.   Respiratory:  Positive for cough. Negative for shortness of breath.   Cardiovascular:  Negative for chest pain.  Musculoskeletal:  Negative for myalgias.  Neurological:  Positive for headaches.   Physical Exam Updated Vital Signs BP (!) 135/91 (BP Location: Right Arm)   Pulse 78   Temp 98.9 F (37.2 C) (Oral)   Resp 16   Ht 5\' 2"  (1.575 m)   LMP 08/24/2021   SpO2 100%   BMI 46.82 kg/m   Physical Exam Vitals and nursing note reviewed. Exam conducted with a chaperone present.  Constitutional:      Appearance: Normal appearance.  HENT:     Head: Normocephalic.     Nose: Congestion present.     Mouth/Throat:     Pharynx: Posterior oropharyngeal erythema present.  Eyes:     Extraocular Movements: Extraocular movements intact.     Pupils: Pupils are equal, round, and reactive to light.  Cardiovascular:     Rate and Rhythm: Normal rate and regular rhythm.  Pulmonary:     Effort: Pulmonary effort is normal.  Breath sounds: Normal breath sounds.     Comments: Lungs CTA bilaterally. No accessory muscle use. Speaking in complete sentences.  Abdominal:     General: Abdomen is flat.     Palpations: Abdomen is soft.  Musculoskeletal:     Cervical back: Normal range of motion.  Neurological:     Mental Status: She is alert.  Psychiatric:        Mood and Affect: Mood normal.    ED Results / Procedures / Treatments   Labs (all labs ordered are listed, but only abnormal results are displayed) Labs Reviewed  RESP PANEL BY RT-PCR (FLU A&B, COVID) [id number]    EKG None  Radiology No results found.  Procedures Procedures   Medications Ordered in ED Medications - No data to display  ED Course  I have reviewed the triage vital signs and the nursing notes.  Pertinent labs & imaging results that were available during my care of the patient were reviewed by me and considered in my medical decision making (see chart for  details).    MDM Rules/Calculators/A&P                           PE and history suspicious for viral etiology. Covid test pending.   No signs of respiratory distress. No hypoxia or tachycardia. Lungs CTA bilaterally. Doubt underlying cardiopulmonary process.  I considered, but think unlikely, dangerous causes of this patient's symptoms to include ACS, CHF or COPD exacerbations, pneumonia, pneumothorax.  Patient is nontoxic appearing and not in need of emergent medical intervention. Patient told to self isolate at home until symptoms subside for 72 hours, and that they will call with the COVID/flu result.   Final Clinical Impression(s) / ED Diagnoses Final diagnoses:  None    Rx / DC Orders ED Discharge Orders     None        Theron Arista, PA-C 09/06/21 1301    Derwood Kaplan, MD 09/06/21 1348

## 2021-09-06 NOTE — ED Triage Notes (Signed)
Patient reports sore throat, congestion, cough x3 days. Pain 10/10

## 2021-09-06 NOTE — Discharge Instructions (Signed)
PE and history suspicious for URI. Covid test pending.   No signs of respiratory distress. No hypoxia or tachycardia. Lungs CTA bilaterally. Doubt underlying cardiopulmonary process.  I considered, but think unlikely, dangerous causes of this patient's symptoms to include ACS, CHF or COPD exacerbations, pneumonia, pneumothorax.  Patient is nontoxic appearing and not in need of emergent medical intervention. Patient told to self isolate at home until symptoms subside for 72 hours, and that they will call with the COVID resul

## 2021-11-30 ENCOUNTER — Encounter (HOSPITAL_COMMUNITY): Payer: Self-pay

## 2021-11-30 ENCOUNTER — Other Ambulatory Visit: Payer: Self-pay

## 2021-11-30 ENCOUNTER — Emergency Department (HOSPITAL_COMMUNITY)
Admission: EM | Admit: 2021-11-30 | Discharge: 2021-11-30 | Disposition: A | Discharge status: Home / Self Care | Payer: BLUE CROSS/BLUE SHIELD | Attending: Emergency Medicine | Admitting: Emergency Medicine

## 2021-11-30 DIAGNOSIS — L509 Urticaria, unspecified: Secondary | ICD-10-CM | POA: Diagnosis present

## 2021-11-30 MED ORDER — FAMOTIDINE 20 MG PO TABS: 20.0000 mg | ORAL_TABLET | Freq: Every day | ORAL | 0 refills | Status: DC

## 2021-11-30 MED ORDER — DIPHENHYDRAMINE HCL 25 MG PO CAPS
25.0000 mg | ORAL_CAPSULE | Freq: Once | ORAL | Status: AC
  Administered 2021-11-30: 25 mg via ORAL
  Filled 2021-11-30: qty 1

## 2021-11-30 MED ORDER — DIPHENHYDRAMINE HCL 25 MG PO TABS: 25.0000 mg | ORAL_TABLET | Freq: Four times a day (QID) | ORAL | 0 refills | Status: AC | PRN

## 2021-11-30 MED ORDER — PREDNISONE 20 MG PO TABS
60.0000 mg | ORAL_TABLET | Freq: Once | ORAL | Status: AC
  Administered 2021-11-30: 60 mg via ORAL
  Filled 2021-11-30: qty 3

## 2021-11-30 MED ORDER — FAMOTIDINE 20 MG PO TABS
20.0000 mg | ORAL_TABLET | Freq: Once | ORAL | Status: AC
Start: 2021-11-30 — End: 2021-11-30
  Administered 2021-11-30: 20 mg via ORAL
  Filled 2021-11-30: qty 1

## 2021-11-30 NOTE — ED Triage Notes (Addendum)
Pt reports with hives on her arms, thighs, and sides of breasts since yesterday evening. Pt states that she does not have allergies to any foods or medications. Pt states that she has had a sinus infection for the past few days.

## 2021-11-30 NOTE — Discharge Instructions (Addendum)
Take the prescribed medication as directed.  I think it is safe to continue your medrol dosepak and your augmentin. May want to use topical hydrocortisone as well to help with itching, this is available over the counter. Follow-up with your primary care doctor. Return to the ED for new or worsening symptoms.

## 2021-11-30 NOTE — ED Provider Notes (Signed)
[place] DEPT Provider Note   CSN: [id number] Arrival date & time: 11/30/21  [id number]     History  Chief Complaint  Patient presents with   Urticaria    Claudia Zimmerman is a 26 y.o. female.  The history is provided by the patient and medical records.  Urticaria  26 year old female presenting to the ED with rash.  States he began last evening around 11PM, lots of itching to her upper extremities, breast, and thighs.  States this has persisted until this morning, now seems worse.  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  She has also been dealing with nasal congestion and was started on augmentin and medrol dosepak yesterday, however did not start the medicine until this AM and rash began last evening.  She has no known medication allergies, has taken amoxicillin and augmentin many times in the past.  Home Medications Prior to Admission medications   Medication Sig Start Date End Date Taking? Authorizing Provider  diphenhydrAMINE (BENADRYL) 25 MG tablet Take 1 tablet (25 mg total) by mouth every 6 (six) hours as needed. 11/30/21  Yes Larene Pickett, PA-C  famotidine (PEPCID) 20 MG tablet Take 1 tablet (20 mg total) by mouth daily. 11/30/21  Yes Larene Pickett, PA-C  acetaminophen (TYLENOL) 325 MG tablet Take 975 mg by mouth every 6 (six) hours as needed for mild pain or headache.    [provider]  benzonatate (TESSALON) 100 MG capsule Take 1 capsule (100 mg total) by mouth every 8 (eight) hours. 09/06/21   Sherrill Raring, PA-C      Allergies    Other    Review of Systems   Review of Systems  Skin:  Positive for rash.  All other systems reviewed and are negative.  Physical Exam Updated Vital Signs BP (!) 151/104 (BP Location: Right Arm)    Pulse (!) 114    Temp 98.2 F (36.8 C) (Oral)    Resp 20    Ht  5\' 2"  (1.575 m)    Wt 116.1 kg    SpO2 98%    BMI 46.82 kg/m   Physical Exam Vitals and nursing note reviewed.  Constitutional:      Appearance: She is well-developed.  HENT:     Head: Normocephalic and atraumatic.  Eyes:     Conjunctiva/sclera: Conjunctivae normal.     Pupils: Pupils are equal, round, and reactive to light.  Cardiovascular:     Rate and Rhythm: Normal rate and regular rhythm.     Heart sounds: Normal heart sounds.  Pulmonary:     Effort: Pulmonary effort is normal. No respiratory distress.     Breath sounds: Normal breath sounds. No rhonchi.  Abdominal:     General: Bowel sounds are normal.     Palpations: Abdomen is soft.     Tenderness: There is no abdominal tenderness. There is no rebound.  Musculoskeletal:        General: Normal range of motion.     Cervical back: Normal range of motion.  Skin:    General: Skin is warm and dry.     Comments: Urticarial rash noted to the anterior chest, beneath the breasts and wrapping around to back, no lesions on palms/soles  Neurological:     Mental Status: She is alert and oriented to person, place, and time.  ED Results / Procedures / Treatments   Labs (all labs ordered are listed, but only abnormal results are displayed) Labs Reviewed - No data to display  EKG None  Radiology No results found.  Procedures Procedures    Medications Ordered in ED Medications  predniSONE (DELTASONE) tablet 60 mg (60 mg Oral Given 11/30/21 0549)  diphenhydrAMINE (BENADRYL) capsule 25 mg (25 mg Oral Given 11/30/21 0549)  famotidine (PEPCID) tablet 20 mg (20 mg Oral Given 11/30/21 0549)    ED Course/ Medical Decision Making/ A&P                           Medical Decision Making Risk OTC drugs. Prescription drug management.   26 year old female presenting to the ED with rash along chest, beneath breasts, and onto the back.  No new soaps or detergents.  Was started on Augmentin, however did not start that until this  morning and rash began yesterday evening prior to taking this medication.  She has taken penicillin numerous times in the past without issue and has no known medication allergies.  Did start a new job this week so unsure if that may be contributing.  She is afebrile and nontoxic, does have a urticarial rash along the chest, beneath breast, and onto the back.  There are no lesions on the palms.  No signs of superimposed infection or cellulitis.  Given prednisone, Benadryl, and Pepcid here.  She was also started on Medrol Dosepak from urgent care so we will have her continue this along with Benadryl and Pepcid at home.  Again, rash preceded taking the Augmentin so I do not think this can be classified as a drug allergy and she has no history of same.  May ultimately be environmental in nature given her new job this past week with associated congestion symptoms.  Will have her follow-up with PCP.  Return here for new concerns.  Final Clinical Impression(s) / ED Diagnoses Final diagnoses:  Urticaria    Rx / DC Orders ED Discharge Orders          Ordered    diphenhydrAMINE (BENADRYL) 25 MG tablet  Every 6 hours PRN        11/30/21 0607    famotidine (PEPCID) 20 MG tablet  Daily        11/30/21 0607              Larene Pickett, PA-C 11/30/21 [id number]    Molpus, Jenny Reichmann, MD 11/30/21 [phone]

## 2022-05-22 ENCOUNTER — Ambulatory Visit (HOSPITAL_COMMUNITY)
Admission: EM | Admit: 2022-05-22 | Discharge: 2022-05-22 | Disposition: A | Discharge status: Home / Self Care | Payer: BLUE CROSS/BLUE SHIELD | Attending: Emergency Medicine | Admitting: Emergency Medicine

## 2022-05-22 ENCOUNTER — Telehealth (HOSPITAL_COMMUNITY): Payer: Self-pay | Admitting: *Deleted

## 2022-05-22 ENCOUNTER — Encounter (HOSPITAL_COMMUNITY): Payer: Self-pay | Admitting: Emergency Medicine

## 2022-05-22 DIAGNOSIS — S61502A Unspecified open wound of left wrist, initial encounter: Secondary | ICD-10-CM | POA: Diagnosis not present

## 2022-05-22 MED ORDER — FLUCONAZOLE 150 MG PO TABS: 150.0000 mg | ORAL_TABLET | ORAL | 0 refills | Status: DC

## 2022-05-22 MED ORDER — DOXYCYCLINE HYCLATE 100 MG PO CAPS: 100.0000 mg | ORAL_CAPSULE | Freq: Two times a day (BID) | ORAL | 0 refills | Status: AC

## 2022-05-22 NOTE — Discharge Instructions (Signed)
5 the cause of the open wounds to your wrist is unknown and as it is drainage and causing pain we will provide bacterial coverage for infection  Take doxycycline every morning and every evening for the next 7 days  Hold warm-hot compresses to affected area at least 4 times a day, this helps to facilitate draining, the more the better  May use Tylenol 500 to 1000 mg every 6 hours and/or ibuprofen 600 to 800 mg every 6-8 hours to help manage her discomfort  Please return for evaluation for increased swelling, increased tenderness or pain, non healing site, non draining site, you begin to have fever or chills

## 2022-05-22 NOTE — ED Provider Notes (Signed)
[facility]    CSN: [id number] Arrival date & time: 05/22/22  1730      History   Chief Complaint Chief Complaint  Patient presents with   Wound Check    HPI Claudia Zimmerman is a 26 y.o. female.   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 Authorizing Provider  acetaminophen (TYLENOL) 325 MG tablet Take 975 mg by mouth every 6 (six) hours as needed for mild pain or headache.    [provider]  benzonatate (TESSALON) 100 MG capsule Take 1 capsule (100 mg total) by mouth every 8 (eight) hours. 09/06/21   Theron Arista, PA-C  diphenhydrAMINE (BENADRYL) 25 MG tablet Take 1 tablet (25 mg total) by mouth every 6 (six) hours as needed. 11/30/21   Garlon Hatchet, PA-C  famotidine (PEPCID) 20 MG tablet Take 1 tablet (20 mg total) by mouth daily. 11/30/21   Garlon Hatchet, PA-C    Family History Family History  Problem Relation Age of Onset   Depression Mother    Anxiety disorder Mother    Headache Father    Bipolar disorder Maternal Grandmother     Social History Social History   Tobacco Use   Smoking status: Never   Smokeless tobacco: Never  Substance Use Topics   Alcohol use: No   Drug use: No     Allergies    Other   Review of Systems Review of Systems   Physical Exam Triage Vital Signs ED Triage Vitals  Enc Vitals Group     BP 05/22/22 1756 123/72     Pulse Rate 05/22/22 1756 78     Resp 05/22/22 1756 18     Temp 05/22/22 1756 98.8 F (37.1 C)     Temp Source 05/22/22 1756 Oral     SpO2 05/22/22 1756 98 %     Weight --      Height --      Head Circumference --      Peak Flow --      Pain Score 05/22/22 1754 4     Pain Loc --      Pain Edu? --      Excl. in GC? --    No data found.  Updated Vital Signs BP 123/72 (BP Location: Right Arm)   Pulse 78   Temp 98.8 F (37.1 C) (Oral)   Resp 18   LMP 04/11/2022   SpO2 98%  Visual Acuity Right Eye Distance:   Left Eye Distance:   Bilateral Distance:    Right Eye Near:   Left Eye Near:    Bilateral Near:     Physical Exam   UC Treatments / Results  Labs (all labs ordered are listed, but only abnormal results are displayed) Labs Reviewed - No data to display  EKG   Radiology No results found.  Procedures Procedures (including critical care time)  Medications Ordered in UC Medications - No data to display  Initial Impression / Assessment and Plan / UC Course  I have reviewed the triage vital signs and the nursing notes.  Pertinent labs & imaging results that were available during my care of the patient were reviewed by me and considered in my medical decision making (see chart for details).     *** Final Clinical Impressions(s) / UC Diagnoses   Final diagnoses:  None   Discharge Instructions   None    ED Prescriptions   None    PDMP not reviewed this encounter.

## 2022-05-22 NOTE — ED Triage Notes (Signed)
Pt c/o open sore on left wrist that has become larger in size the past couple days. Reports did drain.

## 2023-11-25 LAB — HEPATITIS C ANTIBODY: HCV Ab: NEGATIVE

## 2023-11-25 LAB — OB RESULTS CONSOLE RPR: RPR: NONREACTIVE

## 2023-11-25 LAB — OB RESULTS CONSOLE HIV ANTIBODY (ROUTINE TESTING): HIV: NONREACTIVE

## 2023-11-25 LAB — OB RESULTS CONSOLE RUBELLA ANTIBODY, IGM: Rubella: IMMUNE

## 2023-11-25 LAB — OB RESULTS CONSOLE HEPATITIS B SURFACE ANTIGEN: Hepatitis B Surface Ag: NEGATIVE

## 2024-02-08 ENCOUNTER — Encounter: Payer: Self-pay | Admitting: Gastroenterology

## 2024-03-06 ENCOUNTER — Ambulatory Visit: Admitting: Gastroenterology

## 2024-03-06 NOTE — Progress Notes (Deleted)
 Chief Complaint: Primary GI MD: Para Bold  HPI:  Referred by PCP for abnormal LFTs  AST 47/ALT 48/alk phos 50 Normal total bilirubin Patient is currently pregnant   Discussed the use of AI scribe software for clinical note transcription with the patient, who gave verbal consent to proceed.  History of Present Illness      PREVIOUS GI WORKUP     Past Medical History:  Diagnosis Date   Anemia     Past Surgical History:  Procedure Laterality Date   ADENOIDECTOMY Bilateral 2002   Performed in [place]     Current Outpatient Medications  Medication Sig Dispense Refill   acetaminophen  (TYLENOL ) 325 MG tablet Take 975 mg by mouth every 6 (six) hours as needed for mild pain or headache.     diphenhydrAMINE  (BENADRYL ) 25 MG tablet Take 1 tablet (25 mg total) by mouth every 6 (six) hours as needed. 30 tablet 0   fluconazole  (DIFLUCAN ) 150 MG tablet Take 1 tablet (150 mg total) by mouth See admin instructions. Take 2nd tablet by mouth 72 hours after first dose. 2 tablet 0   No current facility-administered medications for this visit.    Allergies as of 03/06/2024 - Review Complete 05/22/2022  Allergen Reaction Noted   Other  12/02/2014    Family History  Problem Relation Age of Onset   Depression Mother    Anxiety disorder Mother    Headache Father    Bipolar disorder Maternal Grandmother     Social History   Socioeconomic History   Marital status: Single    Spouse name: Not on file   Number of children: Not on file   Years of education: Not on file   Highest education level: Not on file  Occupational History   Not on file  Tobacco Use   Smoking status: Never   Smokeless tobacco: Never  Substance and Sexual Activity   Alcohol use: No   Drug use: No   Sexual activity: Yes    Birth control/protection: Pill  Other Topics Concern   Not on file  Social History Narrative   Not on file   Social Drivers of Health   Financial Resource Strain: Low Risk   (11/14/2023)   Received from [facility]   Overall Financial Resource Strain (CARDIA)    Difficulty of Paying Living Expenses: Not very hard  Food Insecurity: No Food Insecurity (11/14/2023)   Received from [facility]   Hunger Vital Sign    Worried About Running Out of Food in the Last Year: Never true    Ran Out of Food in the Last Year: Never true  Transportation Needs: Unmet Transportation Needs (11/14/2023)   Received from [facility] - Transportation    Lack of Transportation (Medical): No    Lack of Transportation (Non-Medical): Yes  Physical Activity: Unknown (11/14/2023)   Received from [facility]   Exercise Vital Sign    Days of Exercise per Week: 0 days    Minutes of Exercise per Session: Not on file  Stress: No Stress Concern Present (11/14/2023)   Received from [facility] - Occupational Stress Questionnaire    Feeling of Stress : Only a little  Social Connections: Socially Integrated (11/14/2023)   Received from [facility]   Social Network    How would you rate your social network (family, work, friends)?: Good participation with social networks  Intimate Partner Violence: Not At Risk (11/14/2023)   Received  from [facility]   HITS    Over the last 12 months how often did your partner physically hurt you?: Never    Over the last 12 months how often did your partner insult you or talk down to you?: Never    Over the last 12 months how often did your partner threaten you with physical harm?: Never    Over the last 12 months how often did your partner scream or curse at you?: Never    Review of Systems:    Constitutional: No weight loss, fever, chills, weakness or fatigue HEENT: Eyes: No change in vision               Ears, Nose, Throat:  No change in hearing or congestion Skin: No rash or itching Cardiovascular: No chest pain, chest pressure or palpitations   Respiratory: No SOB or  cough Gastrointestinal: See HPI and otherwise negative Genitourinary: No dysuria or change in urinary frequency Neurological: No headache, dizziness or syncope Musculoskeletal: No new muscle or joint pain Hematologic: No bleeding or bruising Psychiatric: No history of depression or anxiety    Physical Exam:  Vital signs: There were no vitals taken for this visit.  Constitutional: NAD, alert and cooperative Head:  Normocephalic and atraumatic. Eyes:   PEERL, EOMI. No icterus. Conjunctiva pink. Respiratory: Respirations even and unlabored. Lungs clear to auscultation bilaterally.   No wheezes, crackles, or rhonchi.  Cardiovascular:  Regular rate and rhythm. No peripheral edema, cyanosis or pallor.  Gastrointestinal:  Soft, nondistended, nontender. No rebound or guarding. Normal bowel sounds. No appreciable masses or hepatomegaly. Rectal:  Declines Msk:  Symmetrical without gross deformities. Without edema, no deformity or joint abnormality.  Neurologic:  Alert and  oriented x4;  grossly normal neurologically.  Skin:   Dry and intact without significant lesions or rashes. Psychiatric: Oriented to person, place and time. Demonstrates good judgement and reason without abnormal affect or behaviors.  Physical Exam    RELEVANT LABS AND IMAGING: CBC    Component Value Date/Time   WBC 6.2 02/15/2017 1418   RBC 4.36 02/15/2017 1418   HGB 11.4 02/15/2017 1418   HCT 36.2 02/15/2017 1418   PLT 378 02/15/2017 1418   MCV 83 02/15/2017 1418   MCH 26.1 (L) 02/15/2017 1418   MCHC 31.5 02/15/2017 1418   RDW 15.3 02/15/2017 1418    CMP     Component Value Date/Time   NA 139 02/15/2017 1418   K 4.3 02/15/2017 1418   CL 103 02/15/2017 1418   CO2 22 02/15/2017 1418   GLUCOSE 89 02/15/2017 1418   BUN 8 02/15/2017 1418   CREATININE 0.77 02/15/2017 1418   CALCIUM 9.1 02/15/2017 1418   PROT 6.9 02/15/2017 1418   ALBUMIN 4.2 02/15/2017 1418   AST 15 02/15/2017 1418   ALT 10 02/15/2017  1418   ALKPHOS 40 (L) 02/15/2017 1418   BILITOT <0.2 02/15/2017 1418   GFRNONAA 113 02/15/2017 1418   GFRAA 130 02/15/2017 1418     Assessment/Plan:   Assessment and Plan Assessment & Plan    Elevated LFTs Labs March 2025 AST 47/ALT 48/alk phos 50, normal bilirubin.  Previous baseline from 2018 was normal.  Patient currently pregnant in her second trimester   Suzanna Erp, Kirby Peoples [facility] 03/06/2024, 9:03 AM  Cc: Loa Riling, *

## 2024-04-10 ENCOUNTER — Non-Acute Institutional Stay (HOSPITAL_COMMUNITY)
Admission: RE | Admit: 2024-04-10 | Discharge: 2024-04-10 | Disposition: A | Discharge status: Home / Self Care | Source: Ambulatory Visit | Attending: Internal Medicine | Admitting: Internal Medicine

## 2024-04-10 DIAGNOSIS — Z3A00 Weeks of gestation of pregnancy not specified: Secondary | ICD-10-CM | POA: Diagnosis not present

## 2024-04-10 DIAGNOSIS — O99019 Anemia complicating pregnancy, unspecified trimester: Secondary | ICD-10-CM | POA: Insufficient documentation

## 2024-04-10 MED ORDER — FERUMOXYTOL INJECTION 510 MG/17 ML
510.0000 mg | Freq: Once | INTRAVENOUS | Status: AC
  Administered 2024-04-10: 510 mg via INTRAVENOUS
  Filled 2024-04-10: qty 17

## 2024-04-10 MED ORDER — SODIUM CHLORIDE 0.9 % IV SOLN: INTRAVENOUS | Status: DC | PRN

## 2024-04-10 NOTE — Progress Notes (Signed)
 PATIENT CARE CENTER NOTE   Diagnosis: Anemia of Pregnancy (O99.019)   Provider: Ethridge Herder, MD   Procedure: Feraheme infusion    Note: Patient received Feraheme 510 mg infusion (dose # 1 of 2) via PIV.  Patient tolerated infusion well with no adverse reaction. Observed patient for 30 minutes post infusion. Vital signs stable. Discharge instructions given. Patient to come back in 1 week for second infusion and will schedule next appointment at the front desk. Patient alert, oriented and ambulatory at discharge.

## 2024-04-19 ENCOUNTER — Non-Acute Institutional Stay (HOSPITAL_COMMUNITY)
Admission: RE | Admit: 2024-04-19 | Discharge: 2024-04-19 | Disposition: A | Discharge status: Home / Self Care | Source: Ambulatory Visit | Attending: Internal Medicine | Admitting: Internal Medicine

## 2024-04-19 DIAGNOSIS — O99019 Anemia complicating pregnancy, unspecified trimester: Secondary | ICD-10-CM | POA: Diagnosis present

## 2024-04-19 MED ORDER — SODIUM CHLORIDE 0.9 % IV SOLN
510.0000 mg | Freq: Once | INTRAVENOUS | Status: AC
  Administered 2024-04-19: 510 mg via INTRAVENOUS
  Filled 2024-04-19: qty 17

## 2024-04-19 MED ORDER — SODIUM CHLORIDE 0.9 % IV SOLN: INTRAVENOUS | Status: DC | PRN

## 2024-04-19 NOTE — Progress Notes (Signed)
 PATIENT CARE CENTER NOTE     Diagnosis: Anemia of Pregnancy (O99.019)     Provider: Ethridge Herder, MD     Procedure: Feraheme infusion      Note: Patient received Feraheme 510 mg infusion (dose # 2 of 2) via PIV.  Patient tolerated infusion well with no adverse reaction. Observed patient for 30 minutes post infusion. Vital signs stable. AVS offered but patient refused. Patient alert, oriented and ambulatory at discharge.

## 2024-05-31 LAB — OB RESULTS CONSOLE GBS: GBS: NEGATIVE

## 2024-06-11 ENCOUNTER — Other Ambulatory Visit: Payer: Self-pay

## 2024-06-11 ENCOUNTER — Encounter (HOSPITAL_COMMUNITY): Payer: Self-pay

## 2024-06-11 ENCOUNTER — Inpatient Hospital Stay (HOSPITAL_COMMUNITY)
Admission: AD | Admit: 2024-06-11 | Discharge: 2024-06-15 | DRG: 786 | Disposition: A | Discharge status: Home / Self Care | Attending: Obstetrics and Gynecology | Admitting: Obstetrics and Gynecology

## 2024-06-11 DIAGNOSIS — O9852 Other viral diseases complicating childbirth: Secondary | ICD-10-CM | POA: Diagnosis present

## 2024-06-11 DIAGNOSIS — O411230 Chorioamnionitis, third trimester, not applicable or unspecified: Secondary | ICD-10-CM | POA: Diagnosis present

## 2024-06-11 DIAGNOSIS — O26893 Other specified pregnancy related conditions, third trimester: Secondary | ICD-10-CM | POA: Diagnosis present

## 2024-06-11 DIAGNOSIS — L299 Pruritus, unspecified: Secondary | ICD-10-CM | POA: Diagnosis present

## 2024-06-11 DIAGNOSIS — O26643 Intrahepatic cholestasis of pregnancy, third trimester: Secondary | ICD-10-CM | POA: Diagnosis present

## 2024-06-11 DIAGNOSIS — U071 COVID-19: Secondary | ICD-10-CM | POA: Diagnosis not present

## 2024-06-11 DIAGNOSIS — O26649 Intrahepatic cholestasis of pregnancy, unspecified trimester: Secondary | ICD-10-CM | POA: Diagnosis present

## 2024-06-11 DIAGNOSIS — O99214 Obesity complicating childbirth: Secondary | ICD-10-CM | POA: Diagnosis present

## 2024-06-11 DIAGNOSIS — O9902 Anemia complicating childbirth: Secondary | ICD-10-CM | POA: Diagnosis present

## 2024-06-11 DIAGNOSIS — Z3A37 37 weeks gestation of pregnancy: Secondary | ICD-10-CM | POA: Diagnosis not present

## 2024-06-11 LAB — CBC WITH DIFFERENTIAL/PLATELET
Abs Immature Granulocytes: 0.06 K/uL (ref 0.00–0.07)
Basophils Absolute: 0 K/uL (ref 0.0–0.1)
Basophils Relative: 0 %
Eosinophils Absolute: 0 K/uL (ref 0.0–0.5)
Eosinophils Relative: 0 %
HCT: 34.1 % — ABNORMAL LOW (ref 36.0–46.0)
Hemoglobin: 11.5 g/dL — ABNORMAL LOW (ref 12.0–15.0)
Immature Granulocytes: 1 %
Lymphocytes Relative: 10 %
Lymphs Abs: 0.6 K/uL — ABNORMAL LOW (ref 0.7–4.0)
MCH: 27.6 pg (ref 26.0–34.0)
MCHC: 33.7 g/dL (ref 30.0–36.0)
MCV: 82 fL (ref 80.0–100.0)
Monocytes Absolute: 0.4 K/uL (ref 0.1–1.0)
Monocytes Relative: 8 %
Neutro Abs: 4.7 K/uL (ref 1.7–7.7)
Neutrophils Relative %: 81 %
Platelets: 227 K/uL (ref 150–400)
RBC: 4.16 MIL/uL (ref 3.87–5.11)
RDW: 15.9 % — ABNORMAL HIGH (ref 11.5–15.5)
WBC: 5.8 K/uL (ref 4.0–10.5)
nRBC: 0 % (ref 0.0–0.2)

## 2024-06-11 LAB — COMPREHENSIVE METABOLIC PANEL WITH GFR
ALT: 105 U/L — ABNORMAL HIGH (ref 0–44)
AST: 133 U/L — ABNORMAL HIGH (ref 15–41)
Albumin: 2.9 g/dL — ABNORMAL LOW (ref 3.5–5.0)
Alkaline Phosphatase: 157 U/L — ABNORMAL HIGH (ref 38–126)
Anion gap: 8 (ref 5–15)
BUN: 5 mg/dL — ABNORMAL LOW (ref 6–20)
CO2: 20 mmol/L — ABNORMAL LOW (ref 22–32)
Calcium: 9.1 mg/dL (ref 8.9–10.3)
Chloride: 105 mmol/L (ref 98–111)
Creatinine, Ser: 0.63 mg/dL (ref 0.44–1.00)
GFR, Estimated: >60 mL/min (ref >60)
Glucose, Bld: 109 mg/dL — ABNORMAL HIGH (ref 70–99)
Potassium: 3.6 mmol/L (ref 3.5–5.1)
Sodium: 133 mmol/L — ABNORMAL LOW (ref 135–145)
Total Bilirubin: 0.4 mg/dL (ref 0.0–1.2)
Total Protein: 6.1 g/dL — ABNORMAL LOW (ref 6.5–8.1)

## 2024-06-11 LAB — URINALYSIS, ROUTINE W REFLEX MICROSCOPIC
Bacteria, UA: NONE SEEN
Bilirubin Urine: NEGATIVE
Glucose, UA: NEGATIVE mg/dL
Hgb urine dipstick: NEGATIVE
Ketones, ur: NEGATIVE mg/dL
Leukocytes,Ua: NEGATIVE
Nitrite: NEGATIVE
Protein, ur: 100 mg/dL — AB
Specific Gravity, Urine: 1.031 — ABNORMAL HIGH (ref 1.005–1.030)
pH: 6 (ref 5.0–8.0)

## 2024-06-11 LAB — TYPE AND SCREEN
ABO/RH(D): A POS
Antibody Screen: NEGATIVE

## 2024-06-11 LAB — HEPATITIS PANEL, ACUTE
HCV Ab: NONREACTIVE
Hep A IgM: NONREACTIVE
Hep B C IgM: NONREACTIVE
Hepatitis B Surface Ag: NONREACTIVE

## 2024-06-11 LAB — PROTEIN / CREATININE RATIO, URINE
Creatinine, Urine: 328 mg/dL
Protein Creatinine Ratio: 0.09 mg/mg{creat} (ref 0.00–0.15)
Total Protein, Urine: 31 mg/dL

## 2024-06-11 LAB — RESP PANEL BY RT-PCR (RSV, FLU A&B, COVID)  RVPGX2
Influenza A by PCR: NEGATIVE
Influenza B by PCR: NEGATIVE
Resp Syncytial Virus by PCR: NEGATIVE
SARS Coronavirus 2 by RT PCR: POSITIVE — AB

## 2024-06-11 LAB — RPR: RPR Ser Ql: NONREACTIVE

## 2024-06-11 MED ORDER — FENTANYL-BUPIVACAINE-NACL 0.5-0.125-0.9 MG/250ML-% EP SOLN
12.0000 mL/h | EPIDURAL | Status: DC | PRN
  Administered 2024-06-12 (×2): 12 mL/h via EPIDURAL
  Filled 2024-06-11: qty 250

## 2024-06-11 MED ORDER — TERBUTALINE SULFATE 1 MG/ML IJ SOLN: 0.2500 mg | Freq: Once | INTRAMUSCULAR | Status: DC | PRN

## 2024-06-11 MED ORDER — EPHEDRINE 5 MG/ML INJ: 10.0000 mg | INTRAVENOUS | Status: DC | PRN

## 2024-06-11 MED ORDER — OXYTOCIN BOLUS FROM INFUSION: 333.0000 mL | Freq: Once | INTRAVENOUS | Status: DC

## 2024-06-11 MED ORDER — DIPHENHYDRAMINE HCL 50 MG/ML IJ SOLN: 12.5000 mg | INTRAMUSCULAR | Status: DC | PRN

## 2024-06-11 MED ORDER — PHENYLEPHRINE 80 MCG/ML (10ML) SYRINGE FOR IV PUSH (FOR BLOOD PRESSURE SUPPORT): 80.0000 ug | PREFILLED_SYRINGE | INTRAVENOUS | Status: DC | PRN

## 2024-06-11 MED ORDER — LACTATED RINGERS IV SOLN: INTRAVENOUS | Status: DC

## 2024-06-11 MED ORDER — LACTATED RINGERS IV SOLN: 500.0000 mL | Freq: Once | INTRAVENOUS | Status: DC

## 2024-06-11 MED ORDER — ONDANSETRON HCL 4 MG/2ML IJ SOLN
4.0000 mg | Freq: Four times a day (QID) | INTRAMUSCULAR | Status: DC | PRN
Start: 2024-06-11 — End: 2024-06-12
  Administered 2024-06-12 (×2): 4 mg via INTRAVENOUS
  Filled 2024-06-11: qty 2

## 2024-06-11 MED ORDER — OXYTOCIN-SODIUM CHLORIDE 30-0.9 UT/500ML-% IV SOLN
1.0000 m[IU]/min | INTRAVENOUS | Status: DC
  Administered 2024-06-11 (×2): 2 m[IU]/min via INTRAVENOUS

## 2024-06-11 MED ORDER — OXYCODONE-ACETAMINOPHEN 5-325 MG PO TABS: 1.0000 | ORAL_TABLET | ORAL | Status: DC | PRN

## 2024-06-11 MED ORDER — OXYTOCIN-SODIUM CHLORIDE 30-0.9 UT/500ML-% IV SOLN
2.5000 [IU]/h | INTRAVENOUS | Status: DC
  Filled 2024-06-11: qty 500

## 2024-06-11 MED ORDER — FENTANYL CITRATE (PF) 100 MCG/2ML IJ SOLN
50.0000 ug | INTRAMUSCULAR | Status: DC | PRN
  Administered 2024-06-11 – 2024-06-12 (×6): 100 ug via INTRAVENOUS
  Filled 2024-06-11 (×3): qty 2

## 2024-06-11 MED ORDER — OXYCODONE-ACETAMINOPHEN 5-325 MG PO TABS: 2.0000 | ORAL_TABLET | ORAL | Status: DC | PRN

## 2024-06-11 MED ORDER — LIDOCAINE HCL (PF) 1 % IJ SOLN: 30.0000 mL | INTRAMUSCULAR | Status: DC | PRN

## 2024-06-11 MED ORDER — MISOPROSTOL 25 MCG QUARTER TABLET
25.0000 ug | ORAL_TABLET | ORAL | Status: DC | PRN
  Administered 2024-06-11 (×4): 25 ug via VAGINAL
  Filled 2024-06-11 (×2): qty 1

## 2024-06-11 MED ORDER — SOD CITRATE-CITRIC ACID 500-334 MG/5ML PO SOLN
30.0000 mL | ORAL | Status: DC | PRN
  Administered 2024-06-12 (×2): 30 mL via ORAL

## 2024-06-11 MED ORDER — LACTATED RINGERS IV SOLN: 500.0000 mL | INTRAVENOUS | Status: DC | PRN

## 2024-06-11 NOTE — MAU Provider Note (Addendum)
 URI    S [name] is a 28 y.o. G1P0000 pregnant female at [redacted]w[redacted]d who presents to MAU today via EMS with complaint of URI symptoms starting 8/9. Pt states initially started with congestion on 8/9 before body aches, Tmax 100.89F, and left lower pelvic pain on 8/10.    Receives care at [facility]. Prenatal records reviewed.  Pertinent items noted in HPI and remainder of comprehensive ROS otherwise negative.   O BP 127/80 (BP Location: Right Arm)   Pulse (!) 106   Temp 99.4 F (37.4 C) (Oral)   Resp 18   Ht 5' 3 (1.6 m)   SpO2 99%   BMI 45.35 kg/m  Physical Exam Vitals and nursing note reviewed.  Constitutional:      General: She is not in acute distress.    Appearance: She is well-developed. She is not ill-appearing.  HENT:     Head: Normocephalic and atraumatic.     Comments: Nasal congestion noted on exam     Mouth/Throat:     Mouth: Mucous membranes are moist.     Pharynx: Oropharynx is clear.  Eyes:     Extraocular Movements: Extraocular movements intact.  Cardiovascular:     Rate and Rhythm: Normal rate and regular rhythm.     Heart sounds: Normal heart sounds.  Pulmonary:     Effort: Pulmonary effort is normal. No respiratory distress.     Breath sounds: Normal breath sounds.  Abdominal:     General: There is no distension.     Palpations: Abdomen is soft.     Tenderness: There is no abdominal tenderness.     Comments: Pt states mild tenderness over L round ligament area, unable to recreate on exam  Musculoskeletal:        General: No swelling. Normal range of motion.     Cervical back: Normal range of motion.  Skin:    General: Skin is warm and dry.  Neurological:     Mental Status: She is alert and oriented to person, place, and time.  Psychiatric:        Mood and Affect: Mood normal.        Speech: Speech normal.        Behavior: Behavior normal.    NST: 140bpm, moderate variability, +accels, no decels, baseline uterine irritability but no  regular ctx    MDM: MAU Course:  CBCdiff no leukocytosis, Hgb 11.5 CMP AST and LFT 133/105*, alk phos 157*, nrl tbili  UA 100 protein, noninfectious Quad RVP COVID+  Pt noted to have elevated LFTs as above.  BP nrl and plts nrl.  However pt states she has been having intensive puruitis of b/l hands and feet.  She states tops of hands and feet not the palms and soles.  However she states when she had labs to work this up OP 2 wks ago she was told everything was fine.  She states itching has worsened.  Clinical concern for ICP (less likely HELLP given nrl plts, less likely PEC given nrl Bps but will get UPC to ensure nrl given 100 protein on UA).  Spoke to Dr. Diedre and she agreed with me adding Hepatitis panel and bile acids.  She would like to wait on hep panel prior to admission.   UPC 0.09 Bile acids pending  Hep Panel pending (per lab machine undergoing maintenance and won't be available to run assay until 0730 or 0800 this morning)  Reviewed with Dr. Diedre and LFTs within  40s last week and given COVID + to explain subjective F/C pt reported less likely to be Hepatitis.  Admission for presumed ICP.     AP #[redacted] weeks gestation #ICP  #COVID positive, acute (day 2 of illness)  Admit for IOL given concern for ICP.    Jhonny Augustin BROCKS, MD 06/11/2024 3:40 AM

## 2024-06-11 NOTE — Progress Notes (Signed)
 Labor Note  S: Feeling some more cramping. No other issues  O: BP 139/75   Pulse (!) 103   Temp 98.3 F (36.8 C)   Resp 18   Ht 5' 3 (1.6 m)   Wt 110.7 kg   SpO2 99%   BMI 43.22 kg/m  CE: FB displaced, CE 4-5/60/-3 FHR: Baseline 130, +accels, -decels, mod variability TOCO irregular  A/P: This is a 28 y.o. G1P0000 at [redacted]w[redacted]d  admitted for iOL for presumed ICP with excessive pruritus and LFTs >100 x2 FWB: Cat 1  MWB: COVID+ - precautions in place, otherwise well, normotensive, UPC WNL Labor course: S/p PV cytotec  x2, FB. Now transitioning through latent labor, continue pitocin  2x2 mU/min. Epidural when desired, AROM once comfortable    Anticipate SVD

## 2024-06-11 NOTE — H&P (Signed)
 Claudia Zimmerman is a 28 y.o. female G1P0 @ [redacted]w[redacted]d presented to [facility] with new congestion, body aches, abdominal pain, with Tmax 100.0 at home, as well as worsening of itching of hands and feet that has been present for past 2 weeks.   Good FM. No contractions, LOF, or VB.  In [facility], COVID test positive. CBC/CMP ordered and significant for transaminitis, AST/ALT 133/105.  Hepatitis panel and bile acids ordered and pending.   Pregnancy complicated by:  Obesity: initial BMI 44 Anemia of pregnancy: s/p IV iron Itching of hands/feet: first reported at 36 weeks, LFTs 19/14, bile acids 3.6 at the time  OB History     Gravida  1   Para  0   Term  0   Preterm  0   AB  0   Living  0      SAB  0   IAB  0   Ectopic  0   Multiple  0   Live Births  0          Past Medical History:  Diagnosis Date   Anemia    Past Surgical History:  Procedure Laterality Date   ADENOIDECTOMY Bilateral 2002   Performed in [place]    Family History: family history includes Anxiety disorder in her mother; Bipolar disorder in her maternal grandmother; Depression in her mother; Headache in her father. Social History:  reports that she has never smoked. She has never used smokeless tobacco. She reports that she does not drink alcohol and does not use drugs.     Maternal Diabetes: No Genetic Screening: Normal Maternal Ultrasounds/Referrals: Normal Fetal Ultrasounds or other Referrals:  None Maternal Substance Abuse:  No Significant Maternal Medications:  None Significant Maternal Lab Results:  Group B Strep negative Number of Prenatal Visits:greater than 3 verified prenatal visits Maternal Vaccinations: declined Other Comments:  None  Review of Systems as noted in HPI History Dilation: Closed Effacement (%): Thick Exam by:: Doyal Furnace, RN Blood pressure 127/80, pulse (!) 106, temperature 99.4 F (37.4 C), temperature source Oral, resp. rate 18, height 5' 3 (1.6 m), weight 110.7 kg,  SpO2 99%. Exam Physical Exam   Gen: well appearing CVS: normal pulses Lungs: nonlabored respirations Abd: gravid Ext: no calf edema or tenderness  NST: 125bpm, mod variability, + accels, no decels Toco: quiet  CBC    Component Value Date/Time   WBC 5.8 06/11/2024 0205   RBC 4.16 06/11/2024 0205   HGB 11.5 (L) 06/11/2024 0205   HGB 11.4 02/15/2017 1418   HCT 34.1 (L) 06/11/2024 0205   HCT 36.2 02/15/2017 1418   PLT 227 06/11/2024 0205   PLT 378 02/15/2017 1418   MCV 82.0 06/11/2024 0205   MCV 83 02/15/2017 1418   MCH 27.6 06/11/2024 0205   MCHC 33.7 06/11/2024 0205   RDW 15.9 (H) 06/11/2024 0205   RDW 15.3 02/15/2017 1418   LYMPHSABS 0.6 (L) 06/11/2024 0205   MONOABS 0.4 06/11/2024 0205   EOSABS 0.0 06/11/2024 0205   BASOSABS 0.0 06/11/2024 0205   CMP     Component Value Date/Time   NA 133 (L) 06/11/2024 0205   NA 139 02/15/2017 1418   K 3.6 06/11/2024 0205   CL 105 06/11/2024 0205   CO2 20 (L) 06/11/2024 0205   GLUCOSE 109 (H) 06/11/2024 0205   BUN 5 (L) 06/11/2024 0205   BUN 8 02/15/2017 1418   CREATININE 0.63 06/11/2024 0205   CALCIUM 9.1 06/11/2024 0205   PROT 6.1 (L) 06/11/2024  0205   PROT 6.9 02/15/2017 1418   ALBUMIN 2.9 (L) 06/11/2024 0205   ALBUMIN 4.2 02/15/2017 1418   AST 133 (H) 06/11/2024 0205   ALT 105 (H) 06/11/2024 0205   ALKPHOS 157 (H) 06/11/2024 0205   BILITOT 0.4 06/11/2024 0205   BILITOT <0.2 02/15/2017 1418   GFRNONAA >60 06/11/2024 0205    Latest Reference Range & Units 06/11/24 02:43  Protein Creatinine Ratio 0.00 - 0.15 mg/mgCre 0.09    Prenatal labs: ABO, Rh: --/--/A POS (08/11 9476) Antibody: NEG (08/11 0523) Rubella: Immune (01/24 0000) RPR: Nonreactive (01/24 0000)  HBsAg: Negative (01/24 0000)  HIV: Non-reactive (01/24 0000)  GBS: Negative/-- (07/31 0000)   Assessment/Plan: 27Y G1P0 @ [redacted]w[redacted]d, admit for presumed IHCP, given elevated LFTs with symptoms of IHCP, without signs/symptoms of preeclampsia Fetal wellbeing:  cat I tracing Transaminitis: significant increase in LFTs compared to 1 week ago. Bile acids and hepatitis panel pending. She is normotensive without signs/symptoms of preeclampsia.  COVID+: contact precautions per protocol IOL: cytotec  25mcg PV q4 hr PRN Pain control: may have epidural upon request   Claudia Zimmerman 06/11/2024, 6:45 AM

## 2024-06-11 NOTE — Progress Notes (Signed)
 Labor Note  S: Feeling some more cramping. Itching almost nil since admission  O: BP 128/67   Pulse 86   Temp 99.4 F (37.4 C) (Oral)   Resp 18   Ht 5' 3 (1.6 m)   Wt 110.7 kg   SpO2 99%   BMI 43.22 kg/m  CE: 1/50/-3 FHR: Baseline 120, +accels, -decels, min to modvariability TOCO rare  A/P: This is a 28 y.o. G1P0000 at [redacted]w[redacted]d  admitted for iOL for presumed ICP with excessive pruritus and LFTs >100 x2 FWB: Cat 1  MWB: COVID+ - precautions in place, otherwise well, normotensive, UPC WNL Labor course: S/p PV cytotec  x2, now 1/50/-3. Consented for FB SIUP confirmed vtx. The 31fr Foley was introduced past the internal os and filled with 60cc NS. Tension applied and catheter taped to patient's inner right thigh. Patient tolerated procedure well. Start pitocin  and hold at 2mU/min until displaced   Anticipate SVD

## 2024-06-12 ENCOUNTER — Inpatient Hospital Stay (HOSPITAL_COMMUNITY): Admitting: Anesthesiology

## 2024-06-12 ENCOUNTER — Encounter (HOSPITAL_COMMUNITY)
Admission: AD | Disposition: A | Discharge status: Home / Self Care | Payer: Self-pay | Source: Home / Self Care | Attending: Obstetrics and Gynecology

## 2024-06-12 ENCOUNTER — Encounter (HOSPITAL_COMMUNITY): Payer: Self-pay | Admitting: Obstetrics and Gynecology

## 2024-06-12 DIAGNOSIS — Z3A37 37 weeks gestation of pregnancy: Secondary | ICD-10-CM

## 2024-06-12 LAB — COMPREHENSIVE METABOLIC PANEL WITH GFR
ALT: 91 U/L — ABNORMAL HIGH (ref 0–44)
AST: 82 U/L — ABNORMAL HIGH (ref 15–41)
Albumin: 2.7 g/dL — ABNORMAL LOW (ref 3.5–5.0)
Alkaline Phosphatase: 171 U/L — ABNORMAL HIGH (ref 38–126)
Anion gap: 9 (ref 5–15)
BUN: <5 mg/dL — ABNORMAL LOW (ref 6–20)
CO2: 21 mmol/L — ABNORMAL LOW (ref 22–32)
Calcium: 9.3 mg/dL (ref 8.9–10.3)
Chloride: 103 mmol/L (ref 98–111)
Creatinine, Ser: 0.72 mg/dL (ref 0.44–1.00)
GFR, Estimated: >60 mL/min (ref >60)
Glucose, Bld: 90 mg/dL (ref 70–99)
Potassium: 4.3 mmol/L (ref 3.5–5.1)
Sodium: 133 mmol/L — ABNORMAL LOW (ref 135–145)
Total Bilirubin: 0.9 mg/dL (ref 0.0–1.2)
Total Protein: 6 g/dL — ABNORMAL LOW (ref 6.5–8.1)

## 2024-06-12 LAB — CBC WITH DIFFERENTIAL/PLATELET
Abs Immature Granulocytes: 0.02 K/uL (ref 0.00–0.07)
Basophils Absolute: 0 K/uL (ref 0.0–0.1)
Basophils Relative: 0 %
Eosinophils Absolute: 0 K/uL (ref 0.0–0.5)
Eosinophils Relative: 0 %
HCT: 34.9 % — ABNORMAL LOW (ref 36.0–46.0)
Hemoglobin: 11.7 g/dL — ABNORMAL LOW (ref 12.0–15.0)
Immature Granulocytes: 0 %
Lymphocytes Relative: 10 %
Lymphs Abs: 1 K/uL (ref 0.7–4.0)
MCH: 27.9 pg (ref 26.0–34.0)
MCHC: 33.5 g/dL (ref 30.0–36.0)
MCV: 83.1 fL (ref 80.0–100.0)
Monocytes Absolute: 0.5 K/uL (ref 0.1–1.0)
Monocytes Relative: 5 %
Neutro Abs: 7.9 K/uL — ABNORMAL HIGH (ref 1.7–7.7)
Neutrophils Relative %: 85 %
Platelets: 227 K/uL (ref 150–400)
RBC: 4.2 MIL/uL (ref 3.87–5.11)
RDW: 16.1 % — ABNORMAL HIGH (ref 11.5–15.5)
WBC: 9.4 K/uL (ref 4.0–10.5)
nRBC: 0 % (ref 0.0–0.2)

## 2024-06-12 LAB — BILE ACIDS, TOTAL: Bile Acids Total: 5.9 umol/L (ref 0.0–10.0)

## 2024-06-12 SURGERY — Surgical Case
Anesthesia: Epidural

## 2024-06-12 MED ORDER — KETOROLAC TROMETHAMINE 30 MG/ML IJ SOLN: 30.0000 mg | Freq: Four times a day (QID) | INTRAMUSCULAR | Status: DC | PRN

## 2024-06-12 MED ORDER — DIPHENHYDRAMINE HCL 25 MG PO CAPS: 25.0000 mg | ORAL_CAPSULE | Freq: Four times a day (QID) | ORAL | Status: DC | PRN

## 2024-06-12 MED ORDER — ONDANSETRON HCL 4 MG/2ML IJ SOLN
INTRAMUSCULAR | Status: AC
  Filled 2024-06-12: qty 4

## 2024-06-12 MED ORDER — ENOXAPARIN SODIUM 60 MG/0.6ML IJ SOSY
55.0000 mg | PREFILLED_SYRINGE | INTRAMUSCULAR | Status: DC
  Administered 2024-06-13 – 2024-06-15 (×4): 55 mg via SUBCUTANEOUS
  Filled 2024-06-12 (×3): qty 0.6

## 2024-06-12 MED ORDER — KETOROLAC TROMETHAMINE 30 MG/ML IJ SOLN
30.0000 mg | Freq: Four times a day (QID) | INTRAMUSCULAR | Status: DC | PRN
Start: 2024-06-12 — End: 2024-06-12

## 2024-06-12 MED ORDER — ACETAMINOPHEN 500 MG PO TABS
1000.0000 mg | ORAL_TABLET | Freq: Four times a day (QID) | ORAL | Status: DC | PRN
  Administered 2024-06-12 (×2): 1000 mg via ORAL
  Filled 2024-06-12: qty 2

## 2024-06-12 MED ORDER — CEFAZOLIN SODIUM-DEXTROSE 2-4 GM/100ML-% IV SOLN
INTRAVENOUS | Status: AC
  Filled 2024-06-12: qty 100

## 2024-06-12 MED ORDER — PHENYLEPHRINE 80 MCG/ML (10ML) SYRINGE FOR IV PUSH (FOR BLOOD PRESSURE SUPPORT): 80.0000 ug | PREFILLED_SYRINGE | INTRAVENOUS | Status: DC | PRN

## 2024-06-12 MED ORDER — SODIUM CHLORIDE 0.9% FLUSH
3.0000 mL | INTRAVENOUS | Status: DC | PRN
Start: 2024-06-12 — End: 2024-06-15

## 2024-06-12 MED ORDER — TRANEXAMIC ACID-NACL 1000-0.7 MG/100ML-% IV SOLN
INTRAVENOUS | Status: DC | PRN
  Administered 2024-06-12 (×2): 1000 mg via INTRAVENOUS

## 2024-06-12 MED ORDER — OXYCODONE HCL 5 MG PO TABS
5.0000 mg | ORAL_TABLET | ORAL | Status: DC | PRN
  Administered 2024-06-13 (×2): 5 mg via ORAL
  Filled 2024-06-12 (×2): qty 1

## 2024-06-12 MED ORDER — LIDOCAINE HCL (PF) 1 % IJ SOLN
INTRAMUSCULAR | Status: DC | PRN
  Administered 2024-06-12 (×2): 8 mL via EPIDURAL

## 2024-06-12 MED ORDER — ONDANSETRON HCL 4 MG/2ML IJ SOLN: 4.0000 mg | Freq: Three times a day (TID) | INTRAMUSCULAR | Status: DC | PRN

## 2024-06-12 MED ORDER — LACTATED RINGERS IV SOLN: 500.0000 mL | Freq: Once | INTRAVENOUS | Status: DC

## 2024-06-12 MED ORDER — SENNOSIDES-DOCUSATE SODIUM 8.6-50 MG PO TABS
2.0000 | ORAL_TABLET | Freq: Every day | ORAL | Status: DC
  Administered 2024-06-13 – 2024-06-15 (×4): 2 via ORAL
  Filled 2024-06-12 (×3): qty 2

## 2024-06-12 MED ORDER — NALOXONE HCL 0.4 MG/ML IJ SOLN: 0.4000 mg | INTRAMUSCULAR | Status: DC | PRN

## 2024-06-12 MED ORDER — EPHEDRINE 5 MG/ML INJ: 10.0000 mg | INTRAVENOUS | Status: DC | PRN

## 2024-06-12 MED ORDER — PRENATAL MULTIVITAMIN CH
1.0000 | ORAL_TABLET | Freq: Every day | ORAL | Status: DC
  Administered 2024-06-13 – 2024-06-15 (×4): 1 via ORAL
  Filled 2024-06-12 (×3): qty 1

## 2024-06-12 MED ORDER — SIMETHICONE 80 MG PO CHEW: 80.0000 mg | CHEWABLE_TABLET | ORAL | Status: DC | PRN

## 2024-06-12 MED ORDER — TRANEXAMIC ACID-NACL 1000-0.7 MG/100ML-% IV SOLN
INTRAVENOUS | Status: AC
  Filled 2024-06-12: qty 100

## 2024-06-12 MED ORDER — FENTANYL CITRATE (PF) 100 MCG/2ML IJ SOLN
INTRAMUSCULAR | Status: DC | PRN
  Administered 2024-06-12 (×2): 100 ug via EPIDURAL

## 2024-06-12 MED ORDER — METHYLERGONOVINE MALEATE 0.2 MG PO TABS: 0.2000 mg | ORAL_TABLET | ORAL | Status: DC | PRN

## 2024-06-12 MED ORDER — GENTAMICIN SULFATE 40 MG/ML IJ SOLN
5.0000 mg/kg | INTRAVENOUS | Status: DC
  Administered 2024-06-12 (×2): 380 mg via INTRAVENOUS
  Filled 2024-06-12: qty 9.5

## 2024-06-12 MED ORDER — DIPHENHYDRAMINE HCL 50 MG/ML IJ SOLN: 12.5000 mg | Freq: Four times a day (QID) | INTRAMUSCULAR | Status: DC | PRN

## 2024-06-12 MED ORDER — MENTHOL 3 MG MT LOZG: 1.0000 | LOZENGE | OROMUCOSAL | Status: DC | PRN

## 2024-06-12 MED ORDER — ACETAMINOPHEN 500 MG PO TABS
1000.0000 mg | ORAL_TABLET | Freq: Four times a day (QID) | ORAL | Status: DC
  Administered 2024-06-12 – 2024-06-15 (×16): 1000 mg via ORAL
  Filled 2024-06-12 (×11): qty 2

## 2024-06-12 MED ORDER — DIPHENHYDRAMINE HCL 50 MG/ML IJ SOLN: 12.5000 mg | INTRAMUSCULAR | Status: DC | PRN

## 2024-06-12 MED ORDER — KETOROLAC TROMETHAMINE 30 MG/ML IJ SOLN
30.0000 mg | Freq: Once | INTRAMUSCULAR | Status: AC | PRN
  Administered 2024-06-12 (×2): 30 mg via INTRAVENOUS

## 2024-06-12 MED ORDER — MORPHINE SULFATE (PF) 0.5 MG/ML IJ SOLN
INTRAMUSCULAR | Status: AC
Start: 2024-06-12 — End: 2024-06-12
  Filled 2024-06-12: qty 10

## 2024-06-12 MED ORDER — ACETAMINOPHEN 500 MG PO TABS: 1000.0000 mg | ORAL_TABLET | Freq: Four times a day (QID) | ORAL | Status: DC

## 2024-06-12 MED ORDER — METHYLERGONOVINE MALEATE 0.2 MG/ML IJ SOLN: 0.2000 mg | INTRAMUSCULAR | Status: DC | PRN

## 2024-06-12 MED ORDER — SODIUM CHLORIDE 0.9 % IV SOLN
500.0000 mg | Freq: Once | INTRAVENOUS | Status: AC
  Administered 2024-06-12 (×2): 500 mg via INTRAVENOUS
  Filled 2024-06-12: qty 5

## 2024-06-12 MED ORDER — KETOROLAC TROMETHAMINE 30 MG/ML IJ SOLN
30.0000 mg | Freq: Four times a day (QID) | INTRAMUSCULAR | Status: AC
  Administered 2024-06-12 – 2024-06-13 (×8): 30 mg via INTRAVENOUS
  Filled 2024-06-12 (×4): qty 1

## 2024-06-12 MED ORDER — FENTANYL-BUPIVACAINE-NACL 0.5-0.125-0.9 MG/250ML-% EP SOLN: 12.0000 mL/h | EPIDURAL | Status: DC | PRN

## 2024-06-12 MED ORDER — WITCH HAZEL-GLYCERIN EX PADS: 1.0000 | MEDICATED_PAD | CUTANEOUS | Status: DC | PRN

## 2024-06-12 MED ORDER — FENTANYL CITRATE (PF) 100 MCG/2ML IJ SOLN: 25.0000 ug | INTRAMUSCULAR | Status: DC | PRN

## 2024-06-12 MED ORDER — SODIUM CHLORIDE 0.9 % IV SOLN: INTRAVENOUS | Status: AC | PRN

## 2024-06-12 MED ORDER — DIBUCAINE (PERIANAL) 1 % EX OINT: 1.0000 | TOPICAL_OINTMENT | CUTANEOUS | Status: DC | PRN

## 2024-06-12 MED ORDER — STERILE WATER FOR IRRIGATION IR SOLN
Status: DC | PRN
  Administered 2024-06-12 (×2): 1000 mL

## 2024-06-12 MED ORDER — OXYTOCIN-SODIUM CHLORIDE 30-0.9 UT/500ML-% IV SOLN: 2.5000 [IU]/h | INTRAVENOUS | Status: AC

## 2024-06-12 MED ORDER — IBUPROFEN 600 MG PO TABS
600.0000 mg | ORAL_TABLET | Freq: Four times a day (QID) | ORAL | Status: DC
  Administered 2024-06-13 – 2024-06-15 (×10): 600 mg via ORAL
  Filled 2024-06-12 (×8): qty 1

## 2024-06-12 MED ORDER — ONDANSETRON HCL 4 MG/2ML IJ SOLN
INTRAMUSCULAR | Status: DC | PRN
  Administered 2024-06-12 (×2): 4 mg via INTRAVENOUS

## 2024-06-12 MED ORDER — FENTANYL CITRATE (PF) 100 MCG/2ML IJ SOLN
INTRAMUSCULAR | Status: AC
  Filled 2024-06-12: qty 2

## 2024-06-12 MED ORDER — SODIUM CHLORIDE 0.9 % IV SOLN
2.0000 g | Freq: Four times a day (QID) | INTRAVENOUS | Status: AC
  Administered 2024-06-12 – 2024-06-13 (×8): 2 g via INTRAVENOUS
  Filled 2024-06-12 (×4): qty 2000

## 2024-06-12 MED ORDER — ZOLPIDEM TARTRATE 5 MG PO TABS: 5.0000 mg | ORAL_TABLET | Freq: Every evening | ORAL | Status: DC | PRN

## 2024-06-12 MED ORDER — SOD CITRATE-CITRIC ACID 500-334 MG/5ML PO SOLN
ORAL | Status: AC
  Filled 2024-06-12: qty 30

## 2024-06-12 MED ORDER — LIDOCAINE-EPINEPHRINE (PF) 2 %-1:200000 IJ SOLN
INTRAMUSCULAR | Status: DC | PRN
  Administered 2024-06-12: 5 mL via EPIDURAL
  Administered 2024-06-12: 4 mL via EPIDURAL
  Administered 2024-06-12: 5 mL via EPIDURAL
  Administered 2024-06-12: 4 mL via EPIDURAL

## 2024-06-12 MED ORDER — COCONUT OIL OIL: 1.0000 | TOPICAL_OIL | Status: DC | PRN

## 2024-06-12 MED ORDER — OXYTOCIN-SODIUM CHLORIDE 30-0.9 UT/500ML-% IV SOLN
INTRAVENOUS | Status: DC | PRN
  Administered 2024-06-12 (×2): 300 mL via INTRAVENOUS

## 2024-06-12 MED ORDER — SIMETHICONE 80 MG PO CHEW
80.0000 mg | CHEWABLE_TABLET | Freq: Three times a day (TID) | ORAL | Status: DC
  Administered 2024-06-12 – 2024-06-15 (×10): 80 mg via ORAL
  Filled 2024-06-12 (×8): qty 1

## 2024-06-12 MED ORDER — DEXAMETHASONE SODIUM PHOSPHATE 10 MG/ML IJ SOLN
INTRAMUSCULAR | Status: DC | PRN
Start: 2024-06-12 — End: 2024-06-12
  Administered 2024-06-12 (×2): 10 mg via INTRAVENOUS

## 2024-06-12 MED ORDER — KETOROLAC TROMETHAMINE 30 MG/ML IJ SOLN
INTRAMUSCULAR | Status: AC
  Filled 2024-06-12: qty 1

## 2024-06-12 MED ORDER — MORPHINE SULFATE (PF) 0.5 MG/ML IJ SOLN
INTRAMUSCULAR | Status: DC | PRN
  Administered 2024-06-12 (×2): 3 mg via EPIDURAL

## 2024-06-12 MED ORDER — CEFAZOLIN SODIUM-DEXTROSE 2-3 GM-%(50ML) IV SOLR
INTRAVENOUS | Status: DC | PRN
  Administered 2024-06-12 (×2): 2 g via INTRAVENOUS

## 2024-06-12 SURGICAL SUPPLY — 29 items
CHLORAPREP W/TINT 26 (MISCELLANEOUS) ×2 IMPLANT
CLAMP UMBILICAL CORD (MISCELLANEOUS) ×1 IMPLANT
CLOTH BEACON ORANGE TIMEOUT ST (SAFETY) ×1 IMPLANT
DERMABOND ADVANCED .7 DNX12 (GAUZE/BANDAGES/DRESSINGS) IMPLANT
DRSG OPSITE POSTOP 4X10 (GAUZE/BANDAGES/DRESSINGS) ×1 IMPLANT
ELECTRODE REM PT RTRN 9FT ADLT (ELECTROSURGICAL) ×1 IMPLANT
EXTRACTOR VACUUM KIWI (MISCELLANEOUS) IMPLANT
GLOVE BIO SURGEON STRL SZ7 (GLOVE) ×1 IMPLANT
GLOVE BIOGEL PI IND STRL 7.0 (GLOVE) ×1 IMPLANT
GOWN STRL REUS W/TWL LRG LVL3 (GOWN DISPOSABLE) ×2 IMPLANT
KIT ABG SYR 3ML LUER SLIP (SYRINGE) IMPLANT
MAT PREVALON FULL STRYKER (MISCELLANEOUS) IMPLANT
NDL HYPO 25X5/8 SAFETYGLIDE (NEEDLE) IMPLANT
NEEDLE HYPO 22GX1.5 SAFETY (NEEDLE) IMPLANT
NEEDLE HYPO 25X5/8 SAFETYGLIDE (NEEDLE) IMPLANT
NS IRRIG 1000ML POUR BTL (IV SOLUTION) ×1 IMPLANT
PACK C SECTION WH (CUSTOM PROCEDURE TRAY) ×1 IMPLANT
PAD OB MATERNITY 4.3X12.25 (PERSONAL CARE ITEMS) ×1 IMPLANT
RTRCTR C-SECT PINK 25CM LRG (MISCELLANEOUS) ×1 IMPLANT
SUT CHROMIC 1 CTX 36 (SUTURE) ×2 IMPLANT
SUT CHROMIC 2 0 CT 1 (SUTURE) ×1 IMPLANT
SUT PDS AB 0 CTX 60 (SUTURE) ×1 IMPLANT
SUT VIC AB 2-0 CT1 TAPERPNT 27 (SUTURE) ×1 IMPLANT
SUT VIC AB 4-0 KS 27 (SUTURE) IMPLANT
SUTURE PLAIN GUT 2.0 ETHICON (SUTURE) IMPLANT
SYR 30ML LL (SYRINGE) IMPLANT
TOWEL OR 17X24 6PK STRL BLUE (TOWEL DISPOSABLE) ×1 IMPLANT
TRAY FOLEY W/BAG SLVR 14FR LF (SET/KITS/TRAYS/PACK) ×1 IMPLANT
WATER STERILE IRR 1000ML POUR (IV SOLUTION) ×1 IMPLANT

## 2024-06-12 NOTE — Op Note (Signed)
 Operative Report   Pre-Operative Diagnosis: 1) 37+5-week intrauterine pregnancy 2) nonreassuring fetal wellbeing 3) COVID-positive 4) suspected chorioamnionitis 5) cholestasis of pregnancy  Postoperative Diagnosis: 1) 37+5-week intrauterine pregnancy 2) nonreassuring fetal wellbeing 3) COVID-positive 4) suspected chorioamnionitis 5) cholestasis of pregnancy  Procedure: Primary low-transverse cesarean section  Surgeon: Dr. Marjorie Gull  Assistant: None  Antibiotics: Ancef  2 g, azithromycin  500 mg  Operative Findings: Vigorous female infant in the vertex OP presentation with Apgar scores of 8 at 1 minute and 9 at 5 minutes.  Normal-appearing ovaries and tubes.  Fetal birth weight 5 pounds 15.6 ounces,  2710 g  Specimen: Placenta to pathology  ZAO:[place] I/O In: 1750 [I.V.:1400; IV Piggyback:350] Out: 522 [Urine:150; Blood:372]   Procedure:[name] is an 28 year old gravida 1 para 0 at 37 weeks and 5 days estimated gestational age who presents for cesarean section. Following the appropriate informed consent the patient was brought to the operating room where epidural anesthesia was administered and found to be adequate. She was placed in the dorsal supine position with a leftward tilt. She was prepped and draped in the normal sterile fashion.  The patient was appropriately identified during a preoperative timeout procedure.  The scalpel was then used to make a Pfannenstiel skin incision which was carried down to the underlying layers of soft tissue to the fascia. The fascia was incised in the midline and the fascial incision was extended laterally with Mayo scissors. The superior aspect of the fascial incision was grasped with Coker clamps x2, tented up and the rectus muscles dissected off sharply with the electrocautery unit. The same procedure was repeated on the inferior aspect of the fascial incision. The rectus muscles were separated in the midline. The abdominal peritoneum was identified,  tented up, entered sharply, and the incision was extended superiorly and inferiorly with good visualization of the bladder. The Alexis retractor was then deployed. The vesicouterine peritoneum was identified, tented up, entered sharply, and the bladder flap was created digitally.  The scalpel was then used to make a low transverse incision on the uterus which was extended laterally with blunt dissection. The fetal vertex was identified, delivered easily through the uterine incision followed by the body. The infant was bulb suctioned on the operative field and cried vigorously.  Following a 1 minute delay, the cord was clamped and cut. The infant was passed to the waiting neonatology team. Placenta was then delivered spontaneously and the uterus was cleared of all clot and debris. The uterine incision was repaired with #1 chromic in running locked fashion followed by a second imbricating layer. The ovaries and tubes were inspected and normal. The Alexis retractor was removed. The abdominal peritoneum was reapproximated with 2-0 Vicryl in a running fashion. The rectus muscles was reapproximated with 2-0 chromic in a running fashion. The fascia was closed with 0-looped PDS in a running fashion. The skin was closed with 4-0 vicryl in a subcuticular fashion and Dermabond. All laparotomy sponge, instrument, and needle counts were correct.  The patient tolerated the procedure well and was transferred to the recovery unit in stable condition following the procedure.

## 2024-06-12 NOTE — Anesthesia Preprocedure Evaluation (Addendum)
 Anesthesia Evaluation  Patient identified by MRN, date of birth, ID band Patient awake    Reviewed: Allergy & Precautions, H&P , NPO status , Patient's Chart, lab work & pertinent test results  Airway Mallampati: I  TM Distance: >3 FB Neck ROM: Full    Dental no notable dental hx. (+) Teeth Intact, Dental Advisory Given   Pulmonary neg pulmonary ROS   Pulmonary exam normal breath sounds clear to auscultation       Cardiovascular negative cardio ROS Normal cardiovascular exam Rhythm:Regular Rate:Normal     Neuro/Psych  Headaches negative neurological ROS  negative psych ROS   GI/Hepatic negative GI ROS, Neg liver ROS,,,  Endo/Other    Class 4 obesity  Renal/GU negative Renal ROS  negative genitourinary   Musculoskeletal negative musculoskeletal ROS (+)    Abdominal   Peds negative pediatric ROS (+)  Hematology negative hematology ROS (+) Blood dyscrasia, anemia   Anesthesia Other Findings   Reproductive/Obstetrics negative OB ROS                              Anesthesia Physical Anesthesia Plan  ASA: 3  Anesthesia Plan: Epidural   Post-op Pain Management: Minimal or no pain anticipated   Induction: Intravenous  PONV Risk Score and Plan: 2  Airway Management Planned: Natural Airway and Simple Face Mask  Additional Equipment: Fetal Monitoring  Intra-op Plan:   Post-operative Plan:   Informed Consent: I have reviewed the patients History and Physical, chart, labs and discussed the procedure including the risks, benefits and alternatives for the proposed anesthesia with the patient or authorized representative who has indicated his/her understanding and acceptance.     Dental Advisory Given  Plan Discussed with: Anesthesiologist  Anesthesia Plan Comments: (Labs checked- platelets confirmed with RN in room. Fetal heart tracing, per RN, reported to be stable enough for  sitting procedure. Discussed epidural, and patient consents to the procedure:  included risk of possible headache,backache, failed block, allergic reaction, and nerve injury. This patient was asked if she had any questions or concerns before the procedure started.)         Anesthesia Quick Evaluation

## 2024-06-12 NOTE — Anesthesia Procedure Notes (Signed)
 Epidural Patient location during procedure: OB Start time: 06/12/2024 1:13 AM End time: 06/12/2024 1:17 AM  Staffing Anesthesiologist: Mallory Manus, MD Performed: anesthesiologist   Preanesthetic Checklist Completed: patient identified, IV checked, site marked, risks and benefits discussed, surgical consent, monitors and equipment checked, pre-op evaluation and timeout performed  Epidural Patient position: sitting Prep: DuraPrep and site prepped and draped Patient monitoring: continuous pulse ox and blood pressure Approach: midline Location: L3-L4 Injection technique: LOR saline  Needle:  Needle type: Tuohy  Needle gauge: 17 G Needle length: 9 cm and 9 Needle insertion depth: 9 cm Catheter type: closed end flexible Catheter size: 19 Gauge Test dose: negative  Assessment Events: blood not aspirated, injection not painful, no injection resistance, no paresthesia and negative IV test  Additional Notes   Reason for block:procedure for pain

## 2024-06-12 NOTE — Transfer of Care (Signed)
 Immediate Anesthesia Transfer of Care Note  Patient: Claudia Zimmerman  Procedure(s) Performed: CESAREAN DELIVERY  Patient Location: PACU  Anesthesia Type:Epidural  Level of Consciousness: awake, alert , and oriented  Airway & Oxygen Therapy: Patient Spontanous Breathing  Post-op Assessment: Report given to RN and Post -op Vital signs reviewed and stable  Post vital signs: Reviewed and stable  Last Vitals:  Vitals Value Taken Time  BP 122/57 06/12/24 11:54  Temp 37.3 C 06/12/24 11:54  Pulse 93 06/12/24 11:58  Resp 26 06/12/24 11:58  SpO2 98 % 06/12/24 11:58  Vitals shown include unfiled device data.  Last Pain:  Vitals:   06/12/24 1154  TempSrc: Oral  PainSc:          Complications: No notable events documented.

## 2024-06-12 NOTE — Consult Note (Addendum)
 ANTIBIOTIC CONSULT NOTE - INITIAL  Pharmacy Consult for Gentamicin  Indication: Chorioamnionitis   No Active Allergies  Patient Measurements: Height: 5' 3 (160 cm) Weight: 110.7 kg (244 lb) IBW/kg (Calculated) : 52.4 Adjusted Body Weight: 76 kg  Vital Signs: Temp: 98.5 F (36.9 C) (08/12 0030) Temp Source: Oral (08/12 0030) BP: 133/114 (08/12 0801) Pulse Rate: 107 (08/12 0801)  Labs: Recent Labs    06/11/24 0205 06/11/24 0243 06/12/24 0521  WBC 5.8  --  9.4  HGB 11.5*  --  11.7*  PLT 227  --  227  LABCREA  --  328  --   CREATININE 0.63  --  0.72   No results for input(s): GENTTROUGH, GENTPEAK, GENTRANDOM in the last 72 hours.   Microbiology: Recent Results (from the past 720 hours)  OB RESULT CONSOLE Group B Strep     Status: None   Collection Time: 05/31/24 12:00 AM  Result Value Ref Range Status   GBS Negative  Final  Resp panel by RT-PCR (RSV, Flu A&B, Covid) Anterior Nasal Swab     Status: Abnormal   Collection Time: 06/11/24  1:45 AM   Specimen: Anterior Nasal Swab  Result Value Ref Range Status   SARS Coronavirus 2 by RT PCR POSITIVE (A) NEGATIVE Final   Influenza A by PCR NEGATIVE NEGATIVE Final   Influenza B by PCR NEGATIVE NEGATIVE Final    Comment: (NOTE) The Xpert Xpress SARS-CoV-2/FLU/RSV plus assay is intended as an aid in the diagnosis of influenza from Nasopharyngeal swab specimens and should not be used as a sole basis for treatment. Nasal washings and aspirates are unacceptable for Xpert Xpress SARS-CoV-2/FLU/RSV testing.  Fact Sheet for Patients: BloggerCourse.com  Fact Sheet for Healthcare Providers: SeriousBroker.it  This test is not yet approved or cleared by the [place]  FDA and has been authorized for detection and/or diagnosis of SARS-CoV-2 by FDA under an Emergency Use Authorization (EUA). This EUA will remain in effect (meaning this test can be used) for the duration of  the COVID-19 declaration under Section 564(b)(1) of the Act, 21 U.S.C. section 360bbb-3(b)(1), unless the authorization is terminated or revoked.     Resp Syncytial Virus by PCR NEGATIVE NEGATIVE Final    Comment: (NOTE) Fact Sheet for Patients: BloggerCourse.com  Fact Sheet for Healthcare Providers: SeriousBroker.it  This test is not yet approved or cleared by the [place]  FDA and has been authorized for detection and/or diagnosis of SARS-CoV-2 by FDA under an Emergency Use Authorization (EUA). This EUA will remain in effect (meaning this test can be used) for the duration of the COVID-19 declaration under Section 564(b)(1) of the Act, 21 U.S.C. section 360bbb-3(b)(1), unless the authorization is terminated or revoked.  Performed at [facility], [address] [place], [place] [address]     Medications:  Ampicillin  2 g every 6 hours (8/12>>  Gentamicin  380 mg every 24 hours (8/12>>   Assessment: 28 y.o. female G1P0000 at [redacted]w[redacted]d presenting with maternal fever and tachycardia with concern for chorioamnionitis.  Goal of Therapy:  Gentamicin  peak 6-8 mg/L and Trough < 1 mg/L  Plan:  Gentamicin  380 mg IV every 24 hrs  Check Scr with next labs if gentamicin  continued. Will check gentamicin  levels if continued > 72hr or clinically indicated.  Claudia Zimmerman 06/12/2024,8:13 AM

## 2024-06-12 NOTE — Progress Notes (Signed)
 Patient ID: Claudia Zimmerman, female   DOB: 11/17/1995, 28 y.o.   MRN: [id number]  Patient began having variable decelerations.  Some of these were with a late component.  An IUPC was placed.  Following IUPC placement and patient positioning fetal tracing was noted to have repetitive late decelerations.  Variability was decreasing.  Patient also developed a fever and is now on ampicillin  and gentamicin  for chorioamnionitis.  Cervical exam still 6 to 7 cm.  Given nonreassuring fetal status remote from delivery in the context of chorioamnionitis and COVID infection the decision was made to proceed with cesarean delivery.  Risks/benefits/alternatives were discussed with the patient at length and she wishes to proceed.  Anesthesia in the NICU have been notified of patient's COVID status.

## 2024-06-12 NOTE — Anesthesia Postprocedure Evaluation (Signed)
 Anesthesia Post Note  Patient: Claudia Zimmerman  Procedure(s) Performed: CESAREAN DELIVERY     Patient location during evaluation: PACU Anesthesia Type: Epidural Level of consciousness: awake Pain management: pain level controlled Vital Signs Assessment: post-procedure vital signs reviewed and stable Respiratory status: spontaneous breathing, nonlabored ventilation and respiratory function stable Cardiovascular status: stable Postop Assessment: no headache, no backache and epidural receding Anesthetic complications: no   No notable events documented.  Last Vitals:  Vitals:   06/12/24 1300 06/12/24 1311  BP: 130/75 122/64  Pulse: 84 83  Resp: (!) 24 (!) 22  Temp:  36.9 C  SpO2: 94% 96%    Last Pain:  Vitals:   06/12/24 1311  TempSrc: Oral  PainSc:                  Delon Aisha Arch

## 2024-06-13 LAB — CBC
HCT: 32.5 % — ABNORMAL LOW (ref 36.0–46.0)
Hemoglobin: 10.8 g/dL — ABNORMAL LOW (ref 12.0–15.0)
MCH: 27.3 pg (ref 26.0–34.0)
MCHC: 33.2 g/dL (ref 30.0–36.0)
MCV: 82.3 fL (ref 80.0–100.0)
Platelets: 245 10*3/uL (ref 150–400)
RBC: 3.95 MIL/uL (ref 3.87–5.11)
RDW: 16.1 % — ABNORMAL HIGH (ref 11.5–15.5)
WBC: 17.5 10*3/uL — ABNORMAL HIGH (ref 4.0–10.5)
nRBC: 0 % (ref 0.0–0.2)

## 2024-06-13 NOTE — Lactation Note (Signed)
 This note was copied from a baby's chart. Lactation Consultation Note  Patient Name: Claudia Zimmerman Date: 06/13/2024 Age:26 hours Reason for consult: Initial assessment;1st time breastfeeding;Early term 37-38.6wks.See MOB: MR Per MOB, infant was given 15 mls of formula at 0230 am prior to [facility] entering the room at 0300 am. MOB still wanted try latch infant to see if she is latching infant correctly. Infant briefly latched for 3 minutes  with pillow support, using the cross cradle hold and then became tired probably due to having formula less than 1 hour ago. MOB will continue to work towards latching infant at the breast and will ask for latch assistance if needed. MOB will latch infant first every feeding before offering formula. LC gave handout  Supplementing with Breastfeeding.  MOB will be set up with DEBP by RN and will pump every 3 hours for 15 minutes on initial setting as MOB works on infant learning how to latch at the breast. LC discussed importance of maternal rest, meals and hydration.   MOB knows that her EBM is safe for 4 hours at room temperature whereas formula RTF once open is safe for 1 hour.   Current feeding plan: Day 1 1- Latch infant first every feeding by cues, on demand, 8-12 times within 24 hours, skin to skin.  2- Ask for latch assistance as needed. 3- Continue to supplement infant with any EBM first that is pumped and then formula. 4- MOB will continue to pump every 3 hours for 15 minutes  Maternal Data Does the patient have breastfeeding experience prior to this delivery?: No  Feeding Mother's Current Feeding Choice: Breast Milk and Formula Nipple Type: Nfant Slow Flow (purple)  LATCH Score Latch: Grasps breast easily, tongue down, lips flanged, rhythmical sucking.  Audible Swallowing: A few with stimulation  Type of Nipple: Everted at rest and after stimulation  Comfort (Breast/Nipple): Soft / non-tender  Hold (Positioning): Assistance needed  to correctly position infant at breast and maintain latch.  LATCH Score: 8   Lactation Tools Discussed/Used    Interventions Interventions: Breast feeding basics reviewed;Assisted with latch;Skin to skin;Breast compression;Adjust position;Support pillows;Position options;Education;Guidelines for Milk Supply and Pumping Schedule Handout;LC Services brochure;CDC milk storage guidelines;CDC Guidelines for Breast Pump Cleaning  Discharge Pump: DEBP;Personal  Consult Status Consult Status: Follow-up Date: 06/14/24 Follow-up type: In-patient    Claudia Zimmerman 06/13/2024, 3:31 AM

## 2024-06-13 NOTE — Progress Notes (Addendum)
 Subjective: Postpartum Day 1: Cesarean Delivery Patient reports persistent itching (unsure if worsened after anesthesia or not). Mild congestion which has improved from admission. HA resolved. Pain controlled. Difficulty with feeding.   Objective: Vital signs in last 24 hours: Temp:  [98.4 F (36.9 C)-100.7 F (38.2 C)] 99 F (37.2 C) (08/13 0627) Pulse Rate:  [65-152] 71 (08/13 0627) Resp:  [16-27] 16 (08/13 0230) BP: (94-134)/(57-78) 107/62 (08/13 0627) SpO2:  [94 %-98 %] 98 % (08/13 9372)  Physical Exam:  General: alert, cooperative, and no distress Lochia: appropriate Uterine Fundus: firm Incision: Honeycomb in place, no strike through DVT Evaluation: No evidence of DVT seen on physical exam. No significant calf/ankle edema.  Recent Labs    06/12/24 0521 06/13/24 0608  HGB 11.7* 10.8*  HCT 34.9* 32.5*    Assessment/Plan: 28 yo G1 now P1 POD#1 s/p PCS for NRFHT  - PP: routine care - DVT ppx: lovenox   - COVID+: symptomatically improving  - Transaminitis, unknown etiology: Bile acids normal, suspect more likely related to COVID status than ICP. Pruritus persistent but had recent regional analgesia. Improving. Repeat tomorrow - Desires circumcision. On exam, wandering raphe seen, recommended deferral of circ and outpatient pediatric urology eval. Mom and family were updated and they agree - Chorioamnionitis: s/p ampicillin  x 4 doses, will complete final gentamicin  today - Dispo: Anticipate DC home in 2 days  Larraine DELENA Sharps, DO 06/13/2024, 9:20 AM

## 2024-06-14 LAB — COMPREHENSIVE METABOLIC PANEL WITH GFR
ALT: 35 U/L (ref 0–44)
AST: 27 U/L (ref 15–41)
Albumin: 2.3 g/dL — ABNORMAL LOW (ref 3.5–5.0)
Alkaline Phosphatase: 123 U/L (ref 38–126)
Anion gap: 10 (ref 5–15)
BUN: 8 mg/dL (ref 6–20)
CO2: 23 mmol/L (ref 22–32)
Calcium: 9.1 mg/dL (ref 8.9–10.3)
Chloride: 105 mmol/L (ref 98–111)
Creatinine, Ser: 0.69 mg/dL (ref 0.44–1.00)
GFR, Estimated: >60 mL/min (ref >60)
Glucose, Bld: 70 mg/dL (ref 70–99)
Potassium: 3.6 mmol/L (ref 3.5–5.1)
Sodium: 138 mmol/L (ref 135–145)
Total Bilirubin: 0.6 mg/dL (ref 0.0–1.2)
Total Protein: 5.1 g/dL — ABNORMAL LOW (ref 6.5–8.1)

## 2024-06-14 LAB — CBC WITH DIFFERENTIAL/PLATELET
Abs Immature Granulocytes: 0.09 K/uL — ABNORMAL HIGH (ref 0.00–0.07)
Basophils Absolute: 0 K/uL (ref 0.0–0.1)
Basophils Relative: 0 %
Eosinophils Absolute: 0.1 K/uL (ref 0.0–0.5)
Eosinophils Relative: 1 %
HCT: 29.6 % — ABNORMAL LOW (ref 36.0–46.0)
Hemoglobin: 10.1 g/dL — ABNORMAL LOW (ref 12.0–15.0)
Immature Granulocytes: 1 %
Lymphocytes Relative: 23 %
Lymphs Abs: 2.8 K/uL (ref 0.7–4.0)
MCH: 27.9 pg (ref 26.0–34.0)
MCHC: 34.1 g/dL (ref 30.0–36.0)
MCV: 81.8 fL (ref 80.0–100.0)
Monocytes Absolute: 0.6 K/uL (ref 0.1–1.0)
Monocytes Relative: 5 %
Neutro Abs: 9 K/uL — ABNORMAL HIGH (ref 1.7–7.7)
Neutrophils Relative %: 70 %
Platelets: 252 K/uL (ref 150–400)
RBC: 3.62 MIL/uL — ABNORMAL LOW (ref 3.87–5.11)
RDW: 16.1 % — ABNORMAL HIGH (ref 11.5–15.5)
WBC: 12.6 K/uL — ABNORMAL HIGH (ref 4.0–10.5)
nRBC: 0 % (ref 0.0–0.2)

## 2024-06-14 LAB — SURGICAL PATHOLOGY

## 2024-06-14 MED ORDER — PSEUDOEPHEDRINE HCL ER 120 MG PO TB12
120.0000 mg | ORAL_TABLET | Freq: Two times a day (BID) | ORAL | Status: DC
  Administered 2024-06-14 – 2024-06-15 (×3): 120 mg via ORAL
  Filled 2024-06-14 (×5): qty 1

## 2024-06-14 NOTE — Progress Notes (Signed)
 Subjective: Postpartum Day 2: Cesarean Delivery Pain well controlled. Ambulating, tolerating PO, voiding well, and passing gas. Lochia appropriate. Reports congestion due to Covid, requesting a decongestant.   Objective: Vital signs in last 24 hours: Temp:  [97.4 F (36.3 C)-98.4 F (36.9 C)] 97.4 F (36.3 C) (08/14 0521) Pulse Rate:  [75-80] 80 (08/14 0521) Resp:  [16] 16 (08/14 0521) BP: (109-123)/(62-72) 123/72 (08/14 0521) SpO2:  [97 %-100 %] 100 % (08/14 0521)  Physical Exam:  General: alert, cooperative, and no distress Uterine Fundus: firm, appropriately tender to palpation Incision: Honeycomb in place, no strike through DVT Evaluation: No evidence of DVT seen on physical exam. No significant calf/ankle edema.  Recent Labs    06/13/24 0608 06/14/24 0730  HGB 10.8* 10.1*  HCT 32.5* 29.6*    Assessment/Plan: 28 yo G1 now P1 POD#2 s/p PCS for NRFHT  - PP: routine care - DVT ppx: lovenox   - COVID+: symptomatically improving. Sudafed ordered for congestion. PPE precautions.  - Transaminitis, unknown etiology: Likely associated with Covid infection. Resolved.  - Desires circumcision: prior attending OB recommended deferral of circ due to wandering raphe and outpatient pediatric urology eval.  - Chorioamnionitis: s/p postpartum ampicillin  and gentamicin . Appropriately tender to palpation of uterus.   Dispo: Anticipate discharge home tomorrow.   Rubie DELENA Husky, MD 06/14/2024, 1:34 PM

## 2024-06-14 NOTE — Progress Notes (Signed)
 Call to main pharmacy to check on missing dose of Sudafed, states they sent up dose, but there has been no dose received to 5S MBU. States main pharmacy will send another dose

## 2024-06-15 ENCOUNTER — Other Ambulatory Visit (HOSPITAL_COMMUNITY): Payer: Self-pay

## 2024-06-15 MED ORDER — IBUPROFEN 600 MG PO TABS
600.0000 mg | ORAL_TABLET | Freq: Four times a day (QID) | ORAL | 1 refills | Status: AC
  Filled 2024-06-15: qty 60, 15d supply, fill #0

## 2024-06-15 MED ORDER — OXYCODONE HCL 5 MG PO TABS
5.0000 mg | ORAL_TABLET | ORAL | 0 refills | Status: AC | PRN
  Filled 2024-06-15: qty 10, 2d supply, fill #0

## 2024-06-15 MED ORDER — ACETAMINOPHEN 325 MG PO TABS: 650.0000 mg | ORAL_TABLET | Freq: Four times a day (QID) | ORAL | Status: AC | PRN

## 2024-06-15 NOTE — Discharge Summary (Signed)
 Postpartum Discharge Summary  Date of Service updated 06/15/24      Patient Name: Claudia Zimmerman DOB: 04-05-1996 MRN: [id number]  Date of admission: 06/11/2024 Delivery date:06/12/2024 Delivering provider: OKEY LEADER Date of discharge: 06/15/2024  Admitting diagnosis: Cholestasis during pregnancy [O26.649] Cesarean delivery delivered [O82] Intrauterine pregnancy: [redacted]w[redacted]d     Secondary diagnosis:  Principal Problem:   Cholestasis during pregnancy Active Problems:   Cesarean delivery delivered  Additional problems: COVID + on admision     Discharge diagnosis: Term Pregnancy Delivered                                              Post partum procedures:none Augmentation: AROM, Pitocin , Cytotec , and IP Foley Complications: None  Hospital course: Induction of Labor With Vaginal Delivery   28 y.o. yo G1P1001 at [redacted]w[redacted]d was admitted to the hospital 06/11/2024 for induction of labor for transaminitis in setting of pruritus of hands/feet, consistent with presentation of cholestasis of pregnancy.  Indication for induction: Cholestasis of pregnancy.  Found to be COVID + on admission.  Patient had an labor course complicated by chorioamnionitis, NRFHT Membrane Rupture Time/Date: 1:04 AM,06/12/2024  Delivery Method:C-Section, Low Transverse Operative Delivery:N/A Episiotomy: None Lacerations:  None Details of delivery can be found in separate delivery note.  Patient had a postpartum course complicated by nothing. Patient is discharged home 06/15/24.  Newborn Data: Birth date:06/12/2024 Birth time:10:58 AM Gender:Female Living status:Living Apgars:8 ,9  Weight:2710 g  Magnesium Sulfate received: No BMZ received: No Rhophylac:N/A MMR:N/A T-DaP:no Flu: No RSV Vaccine received: No Transfusion:No Immunizations administered: Immunization History  Administered Date(s) Administered   Tdap 02/20/2017    Physical exam  Vitals:   06/14/24 0521 06/14/24 1435 06/14/24 2200 06/15/24  0415  BP: 123/72 115/73 118/73 134/70  Pulse: 80 75 86 63  Resp: 16 18 18 18   Temp: (!) 97.4 F (36.3 C) 98.1 F (36.7 C) 98.6 F (37 C) 98.1 F (36.7 C)  TempSrc: Oral Oral Oral Oral  SpO2: 100%  98% 100%  Weight:      Height:       General: alert, cooperative, and no distress Lochia: appropriate Uterine Fundus: firm Incision: N/A DVT Evaluation: No evidence of DVT seen on physical exam. Labs: Lab Results  Component Value Date   WBC 12.6 (H) 06/14/2024   HGB 10.1 (L) 06/14/2024   HCT 29.6 (L) 06/14/2024   MCV 81.8 06/14/2024   PLT 252 06/14/2024      Latest Ref Rng & Units 06/14/2024    7:30 AM  CMP  Glucose 70 - 99 mg/dL 70   BUN 6 - 20 mg/dL 8   Creatinine 9.55 - 8.99 mg/dL 9.30   Sodium 864 - 854 mmol/L 138   Potassium 3.5 - 5.1 mmol/L 3.6   Chloride 98 - 111 mmol/L 105   CO2 22 - 32 mmol/L 23   Calcium 8.9 - 10.3 mg/dL 9.1   Total Protein 6.5 - 8.1 g/dL 5.1   Total Bilirubin 0.0 - 1.2 mg/dL 0.6   Alkaline Phos 38 - 126 U/L 123   AST 15 - 41 U/L 27   ALT 0 - 44 U/L 35    Edinburgh Score:    06/12/2024    3:30 PM  Edinburgh Postnatal Depression Scale Screening Tool  I have been able to laugh and see the funny side of  things. 0  I have looked forward with enjoyment to things. 0  I have blamed myself unnecessarily when things went wrong. 1  I have been anxious or worried for no good reason. 0  I have felt scared or panicky for no good reason. 1  Things have been getting on top of me. 1  I have been so unhappy that I have had difficulty sleeping. 0  I have felt sad or miserable. 0  I have been so unhappy that I have been crying. 0  The thought of harming myself has occurred to me. 0  Edinburgh Postnatal Depression Scale Total 3      After visit meds:  Allergies as of 06/15/2024   No Active Allergies      Medication List     STOP taking these medications    fluconazole  150 MG tablet Commonly known as: Diflucan        TAKE these medications     acetaminophen  325 MG tablet Commonly known as: TYLENOL  Take 2 tablets (650 mg total) by mouth every 6 (six) hours as needed for mild pain (pain score 1-3) or headache. What changed: how much to take   diphenhydrAMINE  25 MG tablet Commonly known as: BENADRYL  Take 1 tablet (25 mg total) by mouth every 6 (six) hours as needed.   ibuprofen  600 MG tablet Commonly known as: ADVIL  Take 1 tablet (600 mg total) by mouth every 6 (six) hours.   oxyCODONE  5 MG immediate release tablet Commonly known as: Oxy IR/ROXICODONE  Take 1 tablet (5 mg total) by mouth every 4 (four) hours as needed for severe pain (pain score 7-10).         Discharge home in stable condition Infant Feeding: Breast Infant Disposition:home with mother Discharge instruction: per After Visit Summary and Postpartum booklet. Activity: Advance as tolerated. Pelvic rest for 6 weeks.  Diet: routine diet Anticipated Birth Control: Unsure Postpartum Appointment:6 weeks Additional Postpartum F/U: Incision check 1 week Future Appointments:No future appointments. Follow up Visit:      06/15/2024 [name], MD

## 2024-06-15 NOTE — Lactation Note (Signed)
 This note was copied from a baby's chart. Lactation Consultation Note  Patient Name: Claudia Zimmerman Date: 06/15/2024 Age:26 hours  Reason for consult: Follow-up assessment;Other (Comment);Early term 19-38.6wks;Breastfeeding assistance;Primapara;1st time breastfeeding (positive  Covid)  P1, [redacted]w[redacted]d, 5.5% weight loss  Follow up LC visit. Mother is asymptomatic of Covid, feeling better and eager to go home. She states baby is not latching and she has pumped some. Mother reports seeing drops of colostrum but breast are not heavier and colostrum has not increased. She requested assistance with breastfeeding.  Mother's areola is edematous and reverse pressure softening taught. Mother's nipple is short shafted. Basic breastfeeding education with baby placed in cross cradle position. Baby Claudia Jewels latched well and suckled 2-3 times. Swallows were not noted however mother's colostrum increased with additional hand expression.    Discussed the process of milk production, supply and demand and the importance of breast stimulation and milk removal in order to make an optimal milk supply.  Discussed mother to breastfeed 8-12 times in 24 hours, skin to skin and breast feed before formula feeding. Allow baby to nuzzle and cuddle to increased milk production. If missed feedings at breast or substituting feeding with formula, advised to hand express and/or pump to remove milk from the breast. Advised to pump every 3 hrs ( 8 x/day) to stimulate milk production.    Maternal Data Has patient been taught Hand Expression?: Yes  Feeding Mother's Current Feeding Choice: Breast Milk and Formula Nipple Type: Nfant Slow Flow (purple)  LATCH Score Latch: Repeated attempts needed to sustain latch, nipple held in mouth throughout feeding, stimulation needed to elicit sucking reflex.  Audible Swallowing: None  Type of Nipple: Flat  Comfort (Breast/Nipple): Soft / non-tender  Hold  (Positioning): Assistance needed to correctly position infant at breast and maintain latch.  LATCH Score: 5   Interventions Interventions: Breast feeding basics reviewed;Assisted with latch;Reverse pressure;Breast compression;Support pillows;Position options;Education   Consult Status Consult Status: Complete Date: 06/15/24    Claudia Zimmerman 06/15/2024, 11:23 AM

## 2024-06-15 NOTE — Progress Notes (Signed)
 Subjective: Postpartum Day 3: Cesarean Delivery Pain well controlled. Ambulating, tolerating PO, voiding well, and passing gas. Lochia appropriate.   Objective: Vital signs in last 24 hours: Temp:  [98.1 F (36.7 C)-98.6 F (37 C)] 98.1 F (36.7 C) (08/15 0415) Pulse Rate:  [63-86] 63 (08/15 0415) Resp:  [18] 18 (08/15 0415) BP: (115-134)/(70-73) 134/70 (08/15 0415) SpO2:  [98 %-100 %] 100 % (08/15 0415)  Physical Exam:  General: alert, cooperative, and no distress Uterine Fundus: firm, appropriately tender to palpation Incision: Honeycomb in place, no strike through DVT Evaluation: No evidence of DVT seen on physical exam. No significant calf/ankle edema.  Recent Labs    06/13/24 0608 06/14/24 0730  HGB 10.8* 10.1*  HCT 32.5* 29.6*    Assessment/Plan: 28 yo G1 now P1 POD#3 s/p PCS for NRFHT  - PP: routine care - DVT ppx: lovenox   - COVID+: symptomatically improving. Sudafed ordered for congestion. PPE precautions.  - Transaminitis, unknown etiology: Likely associated with Covid infection. Resolved.  - Desires circumcision: prior attending OB recommended deferral of circ due to wandering raphe and outpatient pediatric urology eval.  - Chorioamnionitis: s/p postpartum ampicillin  and gentamicin . Appropriately tender to palpation of uterus.   Dispo:  meeting milestones for discharge, instructions given   [name], MD 06/15/2024, 11:56 AM

## 2024-06-25 ENCOUNTER — Telehealth (HOSPITAL_COMMUNITY): Payer: Self-pay | Admitting: *Deleted

## 2024-06-25 NOTE — Telephone Encounter (Signed)
 06/25/2024  Name: Claudia Zimmerman MRN: [id number] DOB: 1995-12-18  Reason for Call:  Transition of Care Hospital Discharge Call  Contact Status: Patient Contact Status: Complete  Language assistant needed: Interpreter Mode: Interpreter Not Needed        Follow-Up Questions: Do You Have Any Concerns About Your Health As You Heal From Delivery?: No Do You Have Any Concerns About Your Infants Health?: No  Edinburgh Postnatal Depression Scale:  In the Past 7 Days: I have been able to laugh and see the funny side of things.: As much as I always could I have looked forward with enjoyment to things.: As much as I ever did I have blamed myself unnecessarily when things went wrong.: Not very often I have been anxious or worried for no good reason.: Yes, very often I have felt scared or panicky for no good reason.: No, not much Things have been getting on top of me.: No, most of the time I have coped quite well I have been so unhappy that I have had difficulty sleeping.: Not at all I have felt sad or miserable.: No, not at all I have been so unhappy that I have been crying.: Only occasionally The thought of harming myself has occurred to me.: Never Edinburgh Postnatal Depression Scale Total: 7  PHQ2-9 Depression Scale:     Discharge Follow-up: Edinburgh score requires follow up?: No Patient was advised of the following resources:: Support Group, Breastfeeding Support Group  Post-discharge interventions: Reviewed Newborn Safe Sleep Practices  Mliss Sieve, RN 06/25/2024 11:07
